# Patient Record
Sex: Female | Born: 1955 | Race: White | Hispanic: No | Marital: Married | Smoking: Current every day smoker
Health system: Southern US, Community
[De-identification: ages and names within clinical notes are randomized; demographics above are authoritative.]

## PROBLEM LIST (undated history)

## (undated) DIAGNOSIS — I10 Essential (primary) hypertension: Secondary | ICD-10-CM

## (undated) DIAGNOSIS — F410 Panic disorder [episodic paroxysmal anxiety] without agoraphobia: Secondary | ICD-10-CM

## (undated) DIAGNOSIS — J382 Nodules of vocal cords: Secondary | ICD-10-CM

## (undated) DIAGNOSIS — M549 Dorsalgia, unspecified: Secondary | ICD-10-CM

## (undated) DIAGNOSIS — K589 Irritable bowel syndrome without diarrhea: Secondary | ICD-10-CM

## (undated) DIAGNOSIS — G8929 Other chronic pain: Secondary | ICD-10-CM

## (undated) HISTORY — DX: Nodules of vocal cords: J38.2

## (undated) HISTORY — DX: Essential (primary) hypertension: I10

## (undated) HISTORY — DX: Panic disorder (episodic paroxysmal anxiety): F41.0

## (undated) HISTORY — DX: Other chronic pain: G89.29

## (undated) HISTORY — PX: TONSILLECTOMY: SUR1361

## (undated) HISTORY — DX: Dorsalgia, unspecified: M54.9

## (undated) HISTORY — DX: Irritable bowel syndrome, unspecified: K58.9

---

## 2000-02-16 ENCOUNTER — Encounter: Admission: RE | Admit: 2000-02-16 | Discharge: 2000-02-16 | Payer: Self-pay | Admitting: Obstetrics and Gynecology

## 2000-02-16 ENCOUNTER — Encounter: Payer: Self-pay | Admitting: Obstetrics and Gynecology

## 2002-04-16 ENCOUNTER — Encounter: Payer: Self-pay | Admitting: Obstetrics and Gynecology

## 2002-04-16 ENCOUNTER — Encounter: Admission: RE | Admit: 2002-04-16 | Discharge: 2002-04-16 | Payer: Self-pay | Admitting: Obstetrics and Gynecology

## 2002-12-05 ENCOUNTER — Encounter: Payer: Self-pay | Admitting: Gastroenterology

## 2002-12-05 ENCOUNTER — Encounter: Admission: RE | Admit: 2002-12-05 | Discharge: 2002-12-05 | Payer: Self-pay | Admitting: Gastroenterology

## 2003-09-24 ENCOUNTER — Encounter: Admission: RE | Admit: 2003-09-24 | Discharge: 2003-09-24 | Payer: Self-pay | Admitting: Obstetrics and Gynecology

## 2004-10-07 ENCOUNTER — Encounter: Admission: RE | Admit: 2004-10-07 | Discharge: 2004-10-07 | Payer: Self-pay | Admitting: Obstetrics and Gynecology

## 2004-12-15 ENCOUNTER — Encounter (INDEPENDENT_AMBULATORY_CARE_PROVIDER_SITE_OTHER): Payer: Self-pay | Admitting: *Deleted

## 2004-12-15 ENCOUNTER — Ambulatory Visit (HOSPITAL_BASED_OUTPATIENT_CLINIC_OR_DEPARTMENT_OTHER): Admission: RE | Admit: 2004-12-15 | Discharge: 2004-12-15 | Payer: Self-pay | Admitting: Orthopedic Surgery

## 2004-12-15 ENCOUNTER — Ambulatory Visit (HOSPITAL_COMMUNITY): Admission: RE | Admit: 2004-12-15 | Discharge: 2004-12-15 | Payer: Self-pay | Admitting: Orthopedic Surgery

## 2005-10-30 ENCOUNTER — Encounter: Admission: RE | Admit: 2005-10-30 | Discharge: 2005-10-30 | Payer: Self-pay | Admitting: Obstetrics and Gynecology

## 2006-10-31 ENCOUNTER — Encounter: Admission: RE | Admit: 2006-10-31 | Discharge: 2006-10-31 | Payer: Self-pay | Admitting: Obstetrics and Gynecology

## 2007-11-04 ENCOUNTER — Encounter: Admission: RE | Admit: 2007-11-04 | Discharge: 2007-11-04 | Payer: Self-pay | Admitting: Obstetrics and Gynecology

## 2008-11-04 ENCOUNTER — Encounter: Admission: RE | Admit: 2008-11-04 | Discharge: 2008-11-04 | Payer: Self-pay | Admitting: Obstetrics and Gynecology

## 2009-11-08 ENCOUNTER — Encounter: Admission: RE | Admit: 2009-11-08 | Discharge: 2009-11-08 | Payer: Self-pay | Admitting: Obstetrics and Gynecology

## 2010-10-22 ENCOUNTER — Other Ambulatory Visit: Payer: Self-pay | Admitting: Obstetrics and Gynecology

## 2010-10-22 DIAGNOSIS — Z1239 Encounter for other screening for malignant neoplasm of breast: Secondary | ICD-10-CM

## 2010-11-14 ENCOUNTER — Ambulatory Visit
Admission: RE | Admit: 2010-11-14 | Discharge: 2010-11-14 | Disposition: A | Discharge status: Home / Self Care | Payer: 59 | Source: Ambulatory Visit | Attending: Obstetrics and Gynecology | Admitting: Obstetrics and Gynecology

## 2010-11-14 DIAGNOSIS — Z1239 Encounter for other screening for malignant neoplasm of breast: Secondary | ICD-10-CM

## 2011-02-17 NOTE — Op Note (Signed)
Tiffany Flowers, BARLEY                ACCOUNT NO.:  [id number]   MEDICAL RECORD NO.:  [id number]          PATIENT TYPE:  AMB   LOCATION:  DSC                          FACILITY:  [facility]   PHYSICIAN:  Cindee Salt, M.D.       DATE OF BIRTH:  1956/05/16   DATE OF PROCEDURE:  12/15/2004  DATE OF DISCHARGE:                                 OPERATIVE REPORT   PREOPERATIVE DIAGNOSIS:  Right carpal tunnel syndrome and mucoid cyst, right  middle finger.   POSTOPERATIVE DIAGNOSIS:  Right carpal tunnel syndrome and mucoid cyst,  right middle finger.   OPERATION:  Carpal tunnel release right hand with excision mucoid cyst,  debridement distal interphalangeal joint, right middle finger.   SURGEON:  Cindee Salt, M.D.   ANESTHESIA:  Forearm based IV regional.   HISTORY:  The patient is a 55 year old female with history of carpal tunnel  syndrome, EMG nerve conductions positive, which has not responded to  conservative treatment. She also has a mucoid cyst on her right middle  finger, degenerative changes on x-ray.  She has failed conservative  treatment and desires removal with debridement and release.   PROCEDURE:  The patient is brought to the operating room. Her fingers and  wrist were marked.  She is taken to the operating room where a forearm based  IV regional anesthetic was carried out without difficulty. She was prepped  using DuraPrep, supine position, right arm free. The limb was exsanguinated  during the prep.  The forearm tourniquet was used.  A longitudinal incision  was made in the palm and carried down through subcutaneous tissue. Bleeders  were electrocauterized. Palmar fascia was split, superficial palmar arch  identified.  The  flexor tendon of the ring and little finger identified to  the ulnar side of the median nerve.  Carpal retinaculum was incised with  sharp dissection. A right angle and Sewell retractor placed between skin and  forearm fashion. Fascia was released for  approximately 5 cm proximal to the  wrist crease under direct vision.  Canal was explored. No further lesions  were identified.  An area of compression of the nerve was apparent.  The  wound was irrigated. Skin closed interrupted 5-0 nylon sutures. A separate  incision was then made over the distal interphalangeal joint of the right  middle finger, carried down through subcutaneous tissue. The dissection  carried down.  The cyst identified distally beneath the skin. This was  removed with the micro rongeur. This was sent to pathology. The joint was  opened. Osteophytes were removed from the corners of the distal phalanx and  middle phalanx. The wound was irrigated. The skin closed with interrupted 5-  0 nylon sutures. Sterile compressive dressing to the finger applied.  compressive dressing and splint to the wrist applied. The patient tolerated  the procedure well was taken to the recovery observation in satisfactory  condition. She will be discharged home to return to the [facility] of  [place] in one week on Vicodin.    GK/MEDQ  D:  12/15/2004  T:  12/15/2004  Job:  [id number] 

## 2011-10-18 ENCOUNTER — Other Ambulatory Visit: Payer: Self-pay | Admitting: Physician Assistant

## 2011-10-18 DIAGNOSIS — Z1231 Encounter for screening mammogram for malignant neoplasm of breast: Secondary | ICD-10-CM

## 2011-11-20 ENCOUNTER — Ambulatory Visit: Payer: 59

## 2011-11-27 ENCOUNTER — Ambulatory Visit
Admission: RE | Admit: 2011-11-27 | Discharge: 2011-11-27 | Disposition: A | Discharge status: Home / Self Care | Payer: 59 | Source: Ambulatory Visit | Attending: Physician Assistant | Admitting: Physician Assistant

## 2011-11-27 DIAGNOSIS — Z1231 Encounter for screening mammogram for malignant neoplasm of breast: Secondary | ICD-10-CM

## 2011-11-27 LAB — HM MAMMOGRAPHY

## 2012-10-22 ENCOUNTER — Other Ambulatory Visit: Payer: Self-pay | Admitting: Physician Assistant

## 2012-10-22 DIAGNOSIS — Z1231 Encounter for screening mammogram for malignant neoplasm of breast: Secondary | ICD-10-CM

## 2012-11-30 ENCOUNTER — Encounter: Payer: Self-pay | Admitting: *Deleted

## 2012-12-02 ENCOUNTER — Ambulatory Visit: Payer: 59

## 2012-12-20 ENCOUNTER — Encounter: Payer: Self-pay | Admitting: Physician Assistant

## 2012-12-20 ENCOUNTER — Ambulatory Visit (INDEPENDENT_AMBULATORY_CARE_PROVIDER_SITE_OTHER): Payer: 59 | Admitting: Physician Assistant

## 2012-12-20 ENCOUNTER — Other Ambulatory Visit: Payer: Self-pay | Admitting: Physician Assistant

## 2012-12-20 VITALS — BP 128/76 | HR 68 | Temp 98.7°F | Resp 18 | Ht 59.25 in | Wt 109.0 lb

## 2012-12-20 DIAGNOSIS — Z1239 Encounter for other screening for malignant neoplasm of breast: Secondary | ICD-10-CM

## 2012-12-20 DIAGNOSIS — N3281 Overactive bladder: Secondary | ICD-10-CM

## 2012-12-20 DIAGNOSIS — Z01419 Encounter for gynecological examination (general) (routine) without abnormal findings: Secondary | ICD-10-CM | POA: Insufficient documentation

## 2012-12-20 DIAGNOSIS — I10 Essential (primary) hypertension: Secondary | ICD-10-CM

## 2012-12-20 DIAGNOSIS — N318 Other neuromuscular dysfunction of bladder: Secondary | ICD-10-CM

## 2012-12-20 DIAGNOSIS — G4700 Insomnia, unspecified: Secondary | ICD-10-CM

## 2012-12-20 DIAGNOSIS — G894 Chronic pain syndrome: Secondary | ICD-10-CM

## 2012-12-20 DIAGNOSIS — F410 Panic disorder [episodic paroxysmal anxiety] without agoraphobia: Secondary | ICD-10-CM

## 2012-12-20 MED ORDER — ACETAMINOPHEN-CODEINE #3 300-30 MG PO TABS: 1.0000 | ORAL_TABLET | ORAL | Status: DC | PRN

## 2012-12-20 MED ORDER — SERTRALINE HCL 50 MG PO TABS: 50.0000 mg | ORAL_TABLET | Freq: Every day | ORAL | Status: DC

## 2012-12-21 ENCOUNTER — Other Ambulatory Visit: Payer: Self-pay | Admitting: Physician Assistant

## 2012-12-21 NOTE — Progress Notes (Signed)
Patient ID: Tiffany Flowers MRN: [id number], DOB: Feb 11, 1956, 57 y.o. Date of Encounter: 12/21/2012,   Chief Complaint: Breast & Pelvic Exam. Also discuss depression/ panic.  HPI: 57 y.o. y/o female who saw Dr. Elana Alm in the past for Gyn care is here for her annual pelvic and breast exam.  Also, at her LOV with me 07/24/12 we discussed depression, which was a new problem for her. She was also having insomnia. I Rxed Wellbutrin and Ambien at that time. Today she reports that the Wellbutrin made her feel staggery and she quit taking it. Says she isnot feeling depressed any more. Thinks a lot of depression at that time was situational. Her cat (15y/o) had just died, her mom was very ill, etc. Today she says her mom got better. Her husband is no longer working out of town. She is no longer having tearfulness and is finding pleasure in things again. However, she does report that over the past 2 weeks she has had 3 episodes of panic while driving. Has been quite significant to the point that she needs to pull over and get the car off the road.   The Ambien CR works well for her insomnia. No adverse effects. On the weekends she uses melatonin instead so that she is not using the Ambien all the time to decrease dependence issues.  Review of Systems: Consitutional: No fever, chills, fatigue, night sweats, lymphadenopathy. No significant/unexplained weight changes. Eyes: No visual changes, eye redness, or discharge. ENT/Mouth: No ear pain, sore throat, nasal drainage, or sinus pain. Cardiovascular: No chest pressure,heaviness, tightness or squeezing, even with exertion. No increased shortness of breath or dyspnea on exertion.No palpitations, edema, orthopnea, PND. Respiratory: No cough, hemoptysis, SOB, or wheezing. Gastrointestinal: No anorexia, dysphagia, reflux, pain, nausea, vomiting, hematemesis, diarrhea, constipation, BRBPR, or melena. Breast: No mass, nodules, bulging, or retraction. No skin  changes or inflammation. No nipple discharge. No lymphadenopathy. Genitourinary: No dysuria, hematuria, incontinence, vaginal discharge, pruritis, burning, abnormal bleeding, or pain. Musculoskeletal: No decreased ROM, No joint pain or swelling. No significant pain in neck, back, or extremities. Skin: No rash, pruritis, or concerning lesions. Neurological: No headache, dizziness, syncope, seizures, tremors, memory loss, coordination problems, or paresthesias. Psychological: No anxiety, depression, hallucinations, SI/HI. Endocrine: No polydipsia, polyphagia, polyuria, or known diabetes.No increased fatigue. No palpitations/rapid heart rate. No significant/unexplained weight change. All other systems were reviewed and are otherwise negative.  Past Medical History  Diagnosis Date  . Back pain     Bulging discs  . Vocal cord nodules     Removed     Past Surgical History  Procedure Laterality Date  . Cesarean section      x 2  . Tonsillectomy      Home Meds:  Current Outpatient Prescriptions on File Prior to Visit  Medication Sig Dispense Refill  . ALPRAZolam (XANAX) 0.25 MG tablet Take 0.25 mg by mouth at bedtime as needed for sleep.      Marland Kitchen oxybutynin (DITROPAN-XL) 10 MG 24 hr tablet Take 10 mg by mouth daily.       No current facility-administered medications on file prior to visit.    Allergies:  Allergies  Allergen Reactions  . Wellbutrin (Bupropion) Other (See Comments)    Felt staggery    History   Social History  . Marital Status: Married    Spouse Name: N/A    Number of Children: N/A  . Years of Education: N/A   Occupational History  . Not on file.  Social History Main Topics  . Smoking status: Current Every Day Smoker    Types: Cigarettes  . Smokeless tobacco: Never Used  . Alcohol Use: Yes  . Drug Use: No  . Sexually Active: Not on file   Other Topics Concern  . Not on file   Social History Narrative  . No narrative on file    Family History   Problem Relation Age of Onset  . Heart disease Father 55    CABG  . Hyperlipidemia Sister     Physical Exam: Blood pressure 128/76, pulse 68, temperature 98.7 F (37.1 C), temperature source Oral, resp. rate 18, height 4' 11.25" (1.505 m), weight 109 lb (49.442 kg)., Body mass index is 21.83 kg/(m^2). General: Well developed, well nourished, in no acute distress.   Neck: Supple. Trachea midline. No thyromegaly. Full ROM. No lymphadenopathy.No Carotid Bruits. Lungs: Clear to auscultation bilaterally without wheezes, rales, or rhonchi. Breathing is of normal effort and unlabored. Cardiovascular: RRR with S1 S2. No murmurs, rubs, or gallops. Distal pulses 2+ symmetrically. No carotid or abdominal bruits. Breast: Symmetrical. No masses. Nipples without discharge. Abdomen: Soft, non-tender, non-distended with normoactive bowel sounds. No hepatosplenomegaly or masses. No rebound/guarding. No CVA tenderness. No hernias.  Genitourinary:  External genitalia without lesions. Vaginal mucosa pink.No discharge present. Cervix pink and without discharge. No cervical tenderness.Normal uterus size. No adnexal mass or tenderness.  Skin: Warm and moist without erythema, ecchymosis, wounds, or rash. Neuro: A+Ox3. CN II-XII grossly intact. Moves all extremities spontaneously. Full sensation throughout. Normal gait. Psych:  Responds to questions appropriately with a normal affect.     Immunization History  Administered Date(s) Administered  . Influenza Split 10/02/2009    Assessment/Plan:  57 y.o. y/o female here for: 1-Gyn Exam; Pelvic/Breast Exam.  Repeat Pap not indicated today. Had normal pap 11/28/11. Wait 3-5 years to repeat. She already has screening mammogram scheduled for Monday. Last Mammo 11/27/11 normal. 2-H/O Situational Depression- Now resolved. 3- Panic D/O: Will Start Zoloft 50 mg QD. Discussed proper use and expectation of medication at length. To f/u if dev any adv effects. O/W F/U 6  weeks. 4-Insomnia: Cont Ambien CR 12.5 mg , alternating with Melatonin on weekends and when possible. 5- Pain; Uses Tylenol 3 prn- will refill.  6- Discussed Checking Lipid Panel and other screening labs. She defers.  7- Screening Colonoscopy: She reports that she had this 04/2012 and that it was nml. I did not get a copy of the report.  -  [address] [place], [place], [facility] 12/21/2012 7:15 PM

## 2012-12-23 ENCOUNTER — Ambulatory Visit
Admission: RE | Admit: 2012-12-23 | Discharge: 2012-12-23 | Disposition: A | Discharge status: Home / Self Care | Payer: 59 | Source: Ambulatory Visit | Attending: Physician Assistant | Admitting: Physician Assistant

## 2012-12-23 DIAGNOSIS — Z1231 Encounter for screening mammogram for malignant neoplasm of breast: Secondary | ICD-10-CM

## 2013-01-24 ENCOUNTER — Other Ambulatory Visit: Payer: Self-pay | Admitting: Physician Assistant

## 2013-01-24 NOTE — Telephone Encounter (Signed)
Last visit 12/20/12.  Ok for refills on Alprazolam 0.25mg  bid prn #60 nrf.    And Ditropan-xl 10mg  #30 prn refills. Rx's called/sent to pharmacy.

## 2013-02-03 ENCOUNTER — Other Ambulatory Visit: Payer: Self-pay | Admitting: Physician Assistant

## 2013-02-03 NOTE — Telephone Encounter (Signed)
Approved. # 60/ one additional refill

## 2013-02-03 NOTE — Telephone Encounter (Signed)
Medication refilled per protocol. 

## 2013-02-03 NOTE — Telephone Encounter (Signed)
Last refill 12/20/12.  Last ov 12/20/12 Need approval for controlled medication.

## 2013-02-06 ENCOUNTER — Telehealth: Payer: Self-pay | Admitting: Family Medicine

## 2013-02-06 NOTE — Telephone Encounter (Signed)
Pt states BP has be high within the last couple days been ranging around 152/92-152/94 since Saturday.pt wants to know can she double up on her amlodipine 5mg ?

## 2013-02-06 NOTE — Telephone Encounter (Signed)
Yes, take 10 mg poqday and recheck in 2 weeks.

## 2013-02-07 NOTE — Telephone Encounter (Signed)
..  Patient aware per vm 

## 2013-03-02 ENCOUNTER — Other Ambulatory Visit: Payer: Self-pay | Admitting: Physician Assistant

## 2013-03-04 ENCOUNTER — Other Ambulatory Visit: Payer: Self-pay | Admitting: Family Medicine

## 2013-03-04 MED ORDER — ALPRAZOLAM 0.25 MG PO TABS: 0.2500 mg | ORAL_TABLET | Freq: Every evening | ORAL | Status: DC | PRN

## 2013-03-04 NOTE — Telephone Encounter (Signed)
Refill appropriate.  Just seen in March.  Only uses at bedtime as needed.  #30 + 0 rf called

## 2013-04-18 ENCOUNTER — Other Ambulatory Visit: Payer: Self-pay | Admitting: Physician Assistant

## 2013-04-18 NOTE — Telephone Encounter (Signed)
Last refill 03/04/13 #30.  Last OV 12/20/12 CPE  Refill appropriate  #30 called 0 + 0 RF

## 2013-04-18 NOTE — Telephone Encounter (Signed)
agree

## 2013-05-13 ENCOUNTER — Other Ambulatory Visit: Payer: Self-pay | Admitting: Physician Assistant

## 2013-05-13 NOTE — Telephone Encounter (Signed)
Last refill 04/18/13.  Pt also needs to be seen , has been awhile.  Refill denied (too soon). Called pt left mess to make OV.

## 2013-05-13 NOTE — Telephone Encounter (Signed)
?   Ok to refill, last refill 04/18/13

## 2013-05-16 ENCOUNTER — Telehealth: Payer: Self-pay | Admitting: Family Medicine

## 2013-05-16 MED ORDER — ALPRAZOLAM 0.25 MG PO TABS: 0.2500 mg | ORAL_TABLET | Freq: Every evening | ORAL | Status: DC | PRN

## 2013-05-16 NOTE — Telephone Encounter (Signed)
Seen in March for annual exam.  Refill appropriate.  One month called in.

## 2013-06-02 ENCOUNTER — Other Ambulatory Visit: Payer: Self-pay | Admitting: Physician Assistant

## 2013-06-04 ENCOUNTER — Encounter: Payer: Self-pay | Admitting: Family Medicine

## 2013-06-04 NOTE — Telephone Encounter (Signed)
Her LOV was 3/14. Started Zoloft new-pt was to have f/u OV 6 weeks. I see no f/u OV since that visit. Have her schedule OV.

## 2013-06-04 NOTE — Telephone Encounter (Signed)
Letter sent to patient to schedule appt.

## 2013-06-04 NOTE — Telephone Encounter (Signed)
BP med refilled.  Xanax denied, too early.  Pharmacy called.

## 2013-06-09 ENCOUNTER — Telehealth: Payer: Self-pay | Admitting: Family Medicine

## 2013-06-09 NOTE — Telephone Encounter (Signed)
Left mess for patient that she is due for 6 mth OV.  Had requested last week refill of her Xanax.  Refill request was too early.  Pt stated she did not really need right now she was just thinking ahead.  Also discussed refill of Tylenol #3 which she says she takes prn stomach upset??  Also not needed that as well.  Told when refills due contact her pharmacy for them to request.  Also states now has new job and this office not really convenient any more and will be transferring soon the [facility].  Discussed with her how to get records transferred.

## 2013-06-17 ENCOUNTER — Other Ambulatory Visit: Payer: Self-pay | Admitting: Physician Assistant

## 2013-06-17 NOTE — Telephone Encounter (Signed)
Last refill 8/15.   Had asked for refill earlier this month was denied because too early.  Now due for refill.  Last OV 12/21/12.  OK refill??

## 2013-06-18 NOTE — Telephone Encounter (Signed)
Ok to refill 

## 2013-06-18 NOTE — Telephone Encounter (Signed)
I reviewed her last office visit noted which is 12/20/12. At that time I started Zoloft and she was to have a followup office visit in 6 weeks. Needs office visit. No further refills until office visit

## 2013-06-19 NOTE — Telephone Encounter (Signed)
Pt aware needs office

## 2013-06-23 ENCOUNTER — Telehealth: Payer: Self-pay | Admitting: Family Medicine

## 2013-06-23 NOTE — Telephone Encounter (Signed)
Xanax 0.25 1 QHS prn  Pt has an appt Monday and she says that she gets bad anxiety driving. She wants to know if we can send in one or two pills until her appt to get it through having to drive here and to one other appt she has this week.

## 2013-06-24 NOTE — Telephone Encounter (Signed)
Do you want to refill? 

## 2013-06-25 NOTE — Telephone Encounter (Signed)
Her last office visit was in March. At that time we started new medication and she was to follow up in 6 weeks. We really need to follow up regarding having her on another type of medication to control her anxiety and panic. I really do not advised her taking the Xanax right before driving. No refill now. Keep her followup appointment to discuss.

## 2013-06-26 NOTE — Telephone Encounter (Signed)
Left patient mess to keep appt here on Monday.  Provider will discuss anxiety issues with her then.  We can not prescribe xanax knowing she is taking and then driving.

## 2013-06-30 ENCOUNTER — Encounter: Payer: Self-pay | Admitting: Physician Assistant

## 2013-06-30 ENCOUNTER — Ambulatory Visit (INDEPENDENT_AMBULATORY_CARE_PROVIDER_SITE_OTHER): Payer: 59 | Admitting: Physician Assistant

## 2013-06-30 VITALS — BP 118/76 | HR 84 | Temp 98.7°F | Resp 18 | Ht 60.5 in | Wt 109.0 lb

## 2013-06-30 DIAGNOSIS — G894 Chronic pain syndrome: Secondary | ICD-10-CM

## 2013-06-30 DIAGNOSIS — N318 Other neuromuscular dysfunction of bladder: Secondary | ICD-10-CM

## 2013-06-30 DIAGNOSIS — N3281 Overactive bladder: Secondary | ICD-10-CM

## 2013-06-30 DIAGNOSIS — K589 Irritable bowel syndrome without diarrhea: Secondary | ICD-10-CM

## 2013-06-30 DIAGNOSIS — F410 Panic disorder [episodic paroxysmal anxiety] without agoraphobia: Secondary | ICD-10-CM

## 2013-06-30 DIAGNOSIS — G4700 Insomnia, unspecified: Secondary | ICD-10-CM

## 2013-06-30 DIAGNOSIS — I10 Essential (primary) hypertension: Secondary | ICD-10-CM

## 2013-06-30 MED ORDER — MIRABEGRON ER 25 MG PO TB24: 25.0000 mg | ORAL_TABLET | Freq: Every day | ORAL | Status: DC

## 2013-06-30 MED ORDER — DICYCLOMINE HCL 10 MG PO CAPS: 10.0000 mg | ORAL_CAPSULE | Freq: Three times a day (TID) | ORAL | Status: DC

## 2013-06-30 MED ORDER — ACETAMINOPHEN-CODEINE #3 300-30 MG PO TABS: 1.0000 | ORAL_TABLET | Freq: Four times a day (QID) | ORAL | Status: DC | PRN

## 2013-06-30 MED ORDER — FLUOXETINE HCL 10 MG PO TABS: 10.0000 mg | ORAL_TABLET | Freq: Every day | ORAL | Status: DC

## 2013-06-30 MED ORDER — ALPRAZOLAM 0.25 MG PO TABS: 0.2500 mg | ORAL_TABLET | Freq: Two times a day (BID) | ORAL | Status: DC | PRN

## 2013-06-30 NOTE — Progress Notes (Signed)
Patient ID: DANIELE DILLOW MRN: [id number], DOB: 11-15-1955, 57 y.o. Date of Encounter: @DATE @  Chief Complaint:  Chief Complaint  Patient presents with  . check up for med refills    states needs xanax BID    HPI: 57 y.o. year old white female  presents for routine follow up office visit.  #1 panic disorder: At her last office visit with me on 12/21/12 she reported having episodes of panic while driving. At that visit I prescribed Zoloft 50 mg a day. Also prescribed Xanax to use as needed. Today she reports that she stayed on the Zoloft for about 2-3 months but saw no effect so she stopped it. She has been taking Xanax prior to driving as she is currently driving on [place] and places that she is thinks may cause panic. Says that the Xanax does not cause her to feel loopy or drowsy.  #2 insomnia:  At her prior office visit she comes she was using Ambien CR and was working well with no adverse effects. However today she reports that since then it started causing her to have "crazy dreams quite and her husband realize that she was acting "weird "since she has stopped the Ambien. She uses melatonin at night and sometimes takes Xanax at night and this is working well for her.  #3 overactive bladder: She states that she recently had a job change and it's therefore an insurance change. States that the current medication Ditropan XL is now too expensive shoes she cannot stop it. Also says it is causing significant dry mouth so she thinks she says can go off the medication.  #4 she continues to take Tylenol threes as needed for abdominal bloating and abdominal pain. Gets crampy pains at times.   Past Medical History  Diagnosis Date  . Back pain     Bulging discs  . Vocal cord nodules     Removed  . Panic disorder      Home Meds: See attached medication section for current medication list. Any medications entered into computer today will not appear on this note's list. The medications  listed below were entered prior to today. Current Outpatient Prescriptions on File Prior to Visit  Medication Sig Dispense Refill  . amLODipine (NORVASC) 5 MG tablet TAKE 1 TABLET BY MOUTH EVERY DAY  30 tablet  5  . fish oil-omega-3 fatty acids 1000 MG capsule Take 1 g by mouth daily.      . Multiple Vitamin (MULTIVITAMIN) tablet Take 1 tablet by mouth daily.       No current facility-administered medications on file prior to visit.    Allergies:  Allergies  Allergen Reactions  . Wellbutrin [Bupropion] Other (See Comments)    Felt staggery    History   Social History  . Marital Status: Married    Spouse Name: N/A    Number of Children: N/A  . Years of Education: N/A   Occupational History  . Not on file.   Social History Main Topics  . Smoking status: Current Every Day Smoker    Types: Cigarettes  . Smokeless tobacco: Never Used  . Alcohol Use: Yes  . Drug Use: No  . Sexual Activity: Not on file   Other Topics Concern  . Not on file   Social History Narrative  . No narrative on file    Family History  Problem Relation Age of Onset  . Heart disease Father 90    CABG  . Hyperlipidemia Sister  Review of Systems:  See HPI for pertinent ROS. All other ROS negative.    Physical Exam: Blood pressure 118/76, pulse 84, temperature 98.7 F (37.1 C), temperature source Oral, resp. rate 18, height 5' 0.5" (1.537 m), weight 109 lb (49.442 kg)., Body mass index is 20.93 kg/(m^2). General: Well-nourished well-developed white female . Appears in no acute distress. Lungs: Clear bilaterally to auscultation without wheezes, rales, or rhonchi. Breathing is unlabored. Heart: RRR with S1 S2. No murmurs, rubs, or gallops. Abdomen: Soft, non-tender, non-distended with normoactive bowel sounds. No hepatomegaly. No rebound/guarding. No obvious abdominal masses. Musculoskeletal:  Strength and tone normal for age. Extremities/Skin: Warm and dry. No clubbing or cyanosis. No edema.  No rashes or suspicious lesions. Neuro: Alert and oriented X 3. Moves all extremities spontaneously. Gait is normal. CNII-XII grossly in tact. Psych:  Responds to questions appropriately with a normal affect.very pleasant. Very appropriate does not seem anxious.      ASSESSMENT AND PLAN:  57 y.o. year old female with  1. Panic disorder We'll try Prozac. She is to take one at the 10 mg tablets for one week then increase up to 2 tablets a day to equal 20 mg daily. I told her this time if she develops any adverse effects and call me otherwise continue the medication to followup office visit in 6 weeks. Explained that this is a very low dose and that she may require higher doses have not stoppages because she does not see any effectiveness. - ALPRAZolam (XANAX) 0.25 MG tablet; Take 1 tablet (0.25 mg total) by mouth 2 (two) times daily as needed for sleep or anxiety.  Dispense: 60 tablet; Refill: 1 - FLUoxetine (PROZAC) 10 MG tablet; Take 1 tablet (10 mg total) by mouth daily.  Dispense: 30 tablet; Refill: 0  2. Insomnia History of intolerance to Ambien CR. Continue melatonin and Xanax when necessary. - ALPRAZolam (XANAX) 0.25 MG tablet; Take 1 tablet (0.25 mg total) by mouth 2 (two) times daily as needed for sleep or anxiety.  Dispense: 60 tablet; Refill: 1  3. Essential hypertension, benign At goal. Continue current medication  4. Overactive bladder Current medication is causing adverse effects. Also very expensive. We'll switch to me or Luisa Hart. - mirabegron ER (MYRBETRIQ) 25 MG TB24 tablet; Take 1 tablet (25 mg total) by mouth daily.  Dispense: 30 tablet; Refill: 3  5. Chronic pain syndrome - acetaminophen-codeine (TYLENOL #3) 300-30 MG per tablet; Take 1 tablet by mouth every 6 (six) hours as needed for pain.  Dispense: 60 tablet; Refill: 1  6. IBS (irritable bowel syndrome) We'll add fentanyl. This would work better and in the future she needs to get off of the Tylenol threes. We'll  continue Tylenol 3 for now while she adjusted until. - dicyclomine (BENTYL) 10 MG capsule; Take 1 capsule (10 mg total) by mouth 4 (four) times daily -  before meals and at bedtime.  Dispense: 120 capsule; Refill: 3  Followup office visit 6 weeks or sooner she needs [place].  [address] [place], [place], [facility] 06/30/2013 8:39 AM

## 2013-07-02 ENCOUNTER — Telehealth: Payer: Self-pay | Admitting: Physician Assistant

## 2013-07-02 NOTE — Telephone Encounter (Signed)
Given her circumstances, since the only day she is available to come is November 11th,  would have her go ahead and schedule an appointment to see Dr. Jeanice Lim on that date. Dr. Jeanice Lim is able to read my notes and know what is going with the patient to provide good care for her on that date.

## 2013-07-02 NOTE — Telephone Encounter (Signed)
Pt needs to come in for an appointment for a med check but she just started a new job and the only day they will let her off is 08/12/13 because they are closed. You are not here that day. She wants to know what she can do. Dr. Jeanice Lim is here 08/12/13 so she was not sure if she could see her that day. I told her that she really needs to stick with the same provider, so she does not know what she should do. She works M-F 8-5.

## 2013-07-04 NOTE — Telephone Encounter (Signed)
Pt returned my calls.  November 11 is only day she can come for her follow up visit.  Appt made

## 2013-07-11 ENCOUNTER — Other Ambulatory Visit: Payer: Self-pay | Admitting: Physician Assistant

## 2013-07-11 NOTE — Telephone Encounter (Signed)
Both refill 9/29??  OK refill??

## 2013-07-15 NOTE — Telephone Encounter (Signed)
At OV 9/29 I gave # 60 plus one refill on each of these meds.  Print [place] Drug Registry on this patient.  She must have filled the Rx given 9/29 at a different pharmacy than in past according to data given on refill request.  Print Drug Registry for me to review tomorrow.

## 2013-07-16 MED ORDER — FLUOXETINE HCL 20 MG PO TABS: 20.0000 mg | ORAL_TABLET | Freq: Every day | ORAL | Status: DC

## 2013-07-16 NOTE — Telephone Encounter (Signed)
For the Prozac, she was given a very low dose of 10 mg to start at the office visit. We'll go ahead and increase this to 20.  Send in prescription for Prozac 20 mg 1 by mouth daily #30 with 0 refills.  she is to have a followup office visit in the next 3-4 weeks. Ok to refill the alprazolam for one month supply plus one additional refill.

## 2013-07-16 NOTE — Telephone Encounter (Signed)
Spoke to patient.  Rx's were marked as called in.  Apparently they were not.  Understands now that she will have to pick up pain med refill due to new law.  States she will get that at follow up appt on 08/12/13.  Please call in refill for Alprazolam  and Prozac.

## 2013-07-16 NOTE — Addendum Note (Signed)
Addended by: Donne Anon on: 07/16/2013 04:30 PM   Modules accepted: Orders, Medications

## 2013-07-16 NOTE — Telephone Encounter (Signed)
Refills done as ordered by provider

## 2013-07-27 ENCOUNTER — Other Ambulatory Visit: Payer: Self-pay | Admitting: Physician Assistant

## 2013-07-28 NOTE — Telephone Encounter (Signed)
Medication refilled per protocol. 

## 2013-07-30 ENCOUNTER — Ambulatory Visit: Payer: 59 | Admitting: Physician Assistant

## 2013-08-12 ENCOUNTER — Encounter: Payer: Self-pay | Admitting: Family Medicine

## 2013-08-12 ENCOUNTER — Ambulatory Visit (INDEPENDENT_AMBULATORY_CARE_PROVIDER_SITE_OTHER): Payer: 59 | Admitting: Family Medicine

## 2013-08-12 VITALS — BP 140/80 | HR 78 | Temp 98.6°F | Resp 18 | Ht 60.0 in | Wt 111.0 lb

## 2013-08-12 DIAGNOSIS — G894 Chronic pain syndrome: Secondary | ICD-10-CM

## 2013-08-12 DIAGNOSIS — K589 Irritable bowel syndrome without diarrhea: Secondary | ICD-10-CM

## 2013-08-12 DIAGNOSIS — F410 Panic disorder [episodic paroxysmal anxiety] without agoraphobia: Secondary | ICD-10-CM

## 2013-08-12 DIAGNOSIS — Z23 Encounter for immunization: Secondary | ICD-10-CM

## 2013-08-12 DIAGNOSIS — F411 Generalized anxiety disorder: Secondary | ICD-10-CM | POA: Insufficient documentation

## 2013-08-12 DIAGNOSIS — I10 Essential (primary) hypertension: Secondary | ICD-10-CM

## 2013-08-12 DIAGNOSIS — G4700 Insomnia, unspecified: Secondary | ICD-10-CM

## 2013-08-12 MED ORDER — ALPRAZOLAM 0.25 MG PO TABS: ORAL_TABLET | ORAL | Status: DC

## 2013-08-12 MED ORDER — TEMAZEPAM 15 MG PO CAPS: 15.0000 mg | ORAL_CAPSULE | Freq: Every evening | ORAL | Status: DC | PRN

## 2013-08-12 MED ORDER — FLUOXETINE HCL 20 MG PO TABS: 20.0000 mg | ORAL_TABLET | Freq: Every day | ORAL | Status: DC

## 2013-08-12 MED ORDER — ACETAMINOPHEN-CODEINE #3 300-30 MG PO TABS: 1.0000 | ORAL_TABLET | Freq: Four times a day (QID) | ORAL | Status: DC | PRN

## 2013-08-12 NOTE — Assessment & Plan Note (Signed)
Will give her a trial of temazepam. She can stop the over-the-counter sleep aid and melatonin as this is not helping.

## 2013-08-12 NOTE — Progress Notes (Signed)
  Subjective:    Patient ID: Tiffany Flowers, female    DOB: Dec 13, 1955, 57 y.o.   MRN: [id number]  HPI  Patient here to followup medications. She was seen 6 weeks ago secondary to generalized anxiety disorder insomnia. She was started on Prozac and tapered up to 20 mg. She states that the Prozac is helping some but she continues to rely on her Xanax especially when driving. Her sleep is still very poor she typically takes melatonin as well as an over-the-counter sleep aid and occasionally Xanax. She continues to use the Tylenol #3 for her irritable bowel syndrome and is requesting refill on this.  Review of Systems  GEN- denies fatigue, fever, weight loss,weakness, recent illness HEENT- denies eye drainage, change in vision, nasal discharge, CVS- denies chest pain, palpitations RESP- denies SOB, cough, wheeze ABD- denies N/V, change in stools, abd pain Neuro- denies headache, dizziness, syncope, seizure activity      Objective:   Physical Exam GEN- NAD, alert and oriented x3, repeat BP 148/90 Psych- normal affect and mood Pulses- Radial 2+        Assessment & Plan:

## 2013-08-12 NOTE — Assessment & Plan Note (Signed)
Blood pressure elevated some today. Previously well controlled no change the medication

## 2013-08-12 NOTE — Assessment & Plan Note (Signed)
Continue Prozac 20 mg and Xanax as prescribed

## 2013-08-12 NOTE — Assessment & Plan Note (Signed)
Continue when necessary Xanax

## 2013-08-12 NOTE — Patient Instructions (Signed)
Continue TYlenol #3 Flu shot given Try Temazepam for sleep Continue prozac 20mg - new prescription sent Keep an eye on your blood pressure F/U 3 months

## 2013-08-12 NOTE — Assessment & Plan Note (Signed)
Continue dicyclomine. She is also on Tylenol #3. She's had gastroenterology workup in the past. She may need to return

## 2013-08-14 ENCOUNTER — Other Ambulatory Visit: Payer: Self-pay | Admitting: Physician Assistant

## 2013-09-03 ENCOUNTER — Other Ambulatory Visit: Payer: Self-pay | Admitting: Physician Assistant

## 2013-09-03 NOTE — Telephone Encounter (Signed)
Medication refilled per protocol. 

## 2013-10-07 ENCOUNTER — Other Ambulatory Visit: Payer: Self-pay | Admitting: Family Medicine

## 2013-10-10 NOTE — Telephone Encounter (Signed)
RX called in .

## 2013-10-10 NOTE — Telephone Encounter (Signed)
Okay to refill , give 2 refills 

## 2013-10-10 NOTE — Telephone Encounter (Signed)
Last RF 11/11 #30 + 1  Last OV 11/11  OK refill?

## 2013-11-17 ENCOUNTER — Other Ambulatory Visit: Payer: Self-pay

## 2013-11-17 DIAGNOSIS — Z1231 Encounter for screening mammogram for malignant neoplasm of breast: Secondary | ICD-10-CM

## 2013-12-10 ENCOUNTER — Other Ambulatory Visit: Payer: Self-pay | Admitting: Physician Assistant

## 2013-12-10 ENCOUNTER — Encounter: Payer: Self-pay | Admitting: Family Medicine

## 2013-12-10 DIAGNOSIS — I10 Essential (primary) hypertension: Secondary | ICD-10-CM

## 2013-12-10 NOTE — Telephone Encounter (Signed)
Medication refill for one time only.  Patient needs to be seen.  Letter sent for patient to call and schedule 

## 2013-12-29 ENCOUNTER — Ambulatory Visit
Admission: RE | Admit: 2013-12-29 | Discharge: 2013-12-29 | Disposition: A | Discharge status: Home / Self Care | Payer: 59 | Source: Ambulatory Visit

## 2013-12-29 DIAGNOSIS — Z1231 Encounter for screening mammogram for malignant neoplasm of breast: Secondary | ICD-10-CM

## 2013-12-30 ENCOUNTER — Ambulatory Visit (INDEPENDENT_AMBULATORY_CARE_PROVIDER_SITE_OTHER): Payer: 59 | Admitting: Family Medicine

## 2013-12-30 ENCOUNTER — Encounter: Payer: Self-pay | Admitting: Family Medicine

## 2013-12-30 VITALS — BP 132/76 | HR 72 | Temp 98.4°F | Resp 14 | Ht 60.0 in | Wt 112.0 lb

## 2013-12-30 DIAGNOSIS — Z124 Encounter for screening for malignant neoplasm of cervix: Secondary | ICD-10-CM

## 2013-12-30 DIAGNOSIS — G894 Chronic pain syndrome: Secondary | ICD-10-CM

## 2013-12-30 DIAGNOSIS — I10 Essential (primary) hypertension: Secondary | ICD-10-CM

## 2013-12-30 DIAGNOSIS — G4700 Insomnia, unspecified: Secondary | ICD-10-CM

## 2013-12-30 DIAGNOSIS — Z13820 Encounter for screening for osteoporosis: Secondary | ICD-10-CM

## 2013-12-30 DIAGNOSIS — Z130 Encounter for screening for diseases of the blood and blood-forming organs and certain disorders involving the immune mechanism: Secondary | ICD-10-CM

## 2013-12-30 DIAGNOSIS — Z0000 Encounter for general adult medical examination without abnormal findings: Secondary | ICD-10-CM

## 2013-12-30 DIAGNOSIS — Z1321 Encounter for screening for nutritional disorder: Secondary | ICD-10-CM

## 2013-12-30 DIAGNOSIS — Z13228 Encounter for screening for other metabolic disorders: Secondary | ICD-10-CM

## 2013-12-30 DIAGNOSIS — Z23 Encounter for immunization: Secondary | ICD-10-CM

## 2013-12-30 DIAGNOSIS — K589 Irritable bowel syndrome without diarrhea: Secondary | ICD-10-CM

## 2013-12-30 DIAGNOSIS — F411 Generalized anxiety disorder: Secondary | ICD-10-CM

## 2013-12-30 DIAGNOSIS — Z1329 Encounter for screening for other suspected endocrine disorder: Secondary | ICD-10-CM

## 2013-12-30 LAB — CBC WITH DIFFERENTIAL/PLATELET
Basophils Absolute: 0.1 K/uL (ref 0.0–0.1)
Basophils Relative: 1 % (ref 0–1)
Eosinophils Absolute: 0.1 K/uL (ref 0.0–0.7)
Eosinophils Relative: 2 % (ref 0–5)
HCT: 35.4 % — ABNORMAL LOW (ref 36.0–46.0)
Hemoglobin: 12 g/dL (ref 12.0–15.0)
Lymphocytes Relative: 32 % (ref 12–46)
Lymphs Abs: 1.7 K/uL (ref 0.7–4.0)
MCH: 33.4 pg (ref 26.0–34.0)
MCHC: 33.9 g/dL (ref 30.0–36.0)
MCV: 98.6 fL (ref 78.0–100.0)
Monocytes Absolute: 0.4 K/uL (ref 0.1–1.0)
Monocytes Relative: 8 % (ref 3–12)
Neutro Abs: 3.1 K/uL (ref 1.7–7.7)
Neutrophils Relative %: 57 % (ref 43–77)
Platelets: 267 K/uL (ref 150–400)
RBC: 3.59 MIL/uL — ABNORMAL LOW (ref 3.87–5.11)
RDW: 12.9 % (ref 11.5–15.5)
WBC: 5.4 K/uL (ref 4.0–10.5)

## 2013-12-30 LAB — COMPREHENSIVE METABOLIC PANEL
ALT: 16 U/L (ref 0–35)
AST: 21 U/L (ref 0–37)
Albumin: 4.6 g/dL (ref 3.5–5.2)
Alkaline Phosphatase: 48 U/L (ref 39–117)
BILIRUBIN TOTAL: 0.6 mg/dL (ref 0.2–1.2)
BUN: 8 mg/dL (ref 6–23)
CALCIUM: 9.5 mg/dL (ref 8.4–10.5)
CHLORIDE: 96 meq/L (ref 96–112)
CO2: 27 meq/L (ref 19–32)
Creat: 0.59 mg/dL (ref 0.50–1.10)
Glucose, Bld: 82 mg/dL (ref 70–99)
Potassium: 4.3 mEq/L (ref 3.5–5.3)
SODIUM: 131 meq/L — AB (ref 135–145)
TOTAL PROTEIN: 6.9 g/dL (ref 6.0–8.3)

## 2013-12-30 LAB — LIPID PANEL
Cholesterol: 187 mg/dL (ref 0–200)
HDL: 90 mg/dL (ref >39)
LDL Cholesterol: 74 mg/dL (ref 0–99)
Total CHOL/HDL Ratio: 2.1 Ratio
Triglycerides: 113 mg/dL (ref <150)
VLDL: 23 mg/dL (ref 0–40)

## 2013-12-30 LAB — VITAMIN D 25 HYDROXY (VIT D DEFICIENCY, FRACTURES): Vit D, 25-Hydroxy: 46 ng/mL (ref 30–89)

## 2013-12-30 MED ORDER — ALPRAZOLAM 0.25 MG PO TABS: ORAL_TABLET | ORAL | Status: DC

## 2013-12-30 MED ORDER — AMLODIPINE BESYLATE 5 MG PO TABS: ORAL_TABLET | ORAL | Status: DC

## 2013-12-30 MED ORDER — TEMAZEPAM 15 MG PO CAPS: ORAL_CAPSULE | ORAL | Status: DC

## 2013-12-30 MED ORDER — ACETAMINOPHEN-CODEINE #3 300-30 MG PO TABS: 1.0000 | ORAL_TABLET | Freq: Four times a day (QID) | ORAL | Status: DC | PRN

## 2013-12-30 NOTE — Assessment & Plan Note (Signed)
Well controlled on restoril.

## 2013-12-30 NOTE — Patient Instructions (Addendum)
Calcium 1200mg   , Vitamin D 1000IU I recommend eye visit once a year I recommend dental visit every 6 months Goal is to  Exercise 30 minutes 5 days a week We will send a letter with lab results  Bone density to be set up  Tetanus given Release of records- Dr. Charna ElizabethJyothi Mann for Colonoscopy F/U 6 months for medications

## 2013-12-30 NOTE — Assessment & Plan Note (Signed)
Pap smear done her last one was in 2013 will set up for bone density test fasting labs tetanus booster given  Discussed calcium and vitamin D

## 2013-12-30 NOTE — Assessment & Plan Note (Signed)
Continue Tylenol #3 which helps with her IBS pain she's been on this for quite some time I will obtain her last colonoscopy

## 2013-12-30 NOTE — Assessment & Plan Note (Signed)
We will monitor off her medications for now she will continue her Xanax low dose as needed

## 2013-12-30 NOTE — Assessment & Plan Note (Signed)
Blood pressure well controlled Check fasting labs

## 2013-12-30 NOTE — Progress Notes (Signed)
Patient ID: Tiffany Flowers, female   DOB: [birth year], 58 y.o.   MRN: [id number]   Subjective:    Patient ID: Tiffany Flowers, female    DOB: 12-24-1955, 58 y.o.   MRN: [id number]  Patient presents for Pap and Medication review and refill  patient here for annual examination and Pap smear she last had a Pap smear in 2013 which was normal the exception of some atrophy. Her colonoscopy was done in 2013 will we do not have the report of this. She is due for a tetanus booster. Her mammogram was done yesterday. She's due for fasting labs she does have a history of hypertension and is currently on amlodipine. She's no specific concerns today is overall doing well. Of note she does have history of panic disorder and generalized anxiety she was tried on multiple medications she was last on Prozac but did not see any improvement therefore she stopped taking this she does take her Xanax almost daily to help her with driving. She's still on Tylenol #3 with codeine for her irritable bowel syndrome which has been chronic for her.    Review Of Systems:  GEN- denies fatigue, fever, weight loss,weakness, recent illness HEENT- denies eye drainage, change in vision, nasal discharge, CVS- denies chest pain, palpitations RESP- denies SOB, cough, wheeze ABD- denies N/V, change in stools, abd pain GU- denies dysuria, hematuria, dribbling, incontinence MSK- denies joint pain, muscle aches, injury Neuro- denies headache, dizziness, syncope, seizure activity       Objective:    BP 132/76  Pulse 72  Temp(Src) 98.4 F (36.9 C) (Oral)  Resp 14  Ht 5' (1.524 m)  Wt 112 lb (50.803 kg)  BMI 21.87 kg/m2 GEN- NAD, alert and oriented x3 HEENT- PERRL, EOMI, non injected sclera, pink conjunctiva, MMM, oropharynx clear Neck- Supple, no thyromegaly Breast- normal symmetry, no nipple inversion,no nipple drainage, no nodules or lumps felt Nodes- no axillary nodes CVS- RRR, no murmur RESP-CTAB GU- normal external  genitalia, vaginal mucosa pink and moist, cervix visualized no growth - atrophy at os, no blood form os, no discharge, no CMT, no ovarian masses, uterus normal size ABD-NABS,soft,NT,ND EXT- No edema Pulses- Radial, DP- 2+ Psych- normal affect and mood        Assessment & Plan:      Problem List Items Addressed This Visit   Routine general medical examination at a health care facility     Pap smear done her last one was in 2013 will set up for bone density test fasting labs tetanus booster given  Discussed calcium and vitamin D    Relevant Orders      CBC with Differential      Comprehensive metabolic panel      Lipid panel   Insomnia     Well controlled on restoril    IBS (irritable bowel syndrome)     Continue Tylenol #3 which helps with her IBS pain she's been on this for quite some time I will obtain her last colonoscopy    GAD (generalized anxiety disorder)     We will monitor off her medications for now she will continue her Xanax low dose as needed    Essential hypertension, benign     Blood pressure well controlled Check fasting labs    Relevant Medications      amLODIpine (NORVASC) tablet   Chronic pain syndrome - Primary   Relevant Medications      acetaminophen-codeine (TYLENOL #3) 300-30 MG per tablet  Other Visit Diagnoses   HTN (hypertension)        Relevant Orders       Lipid panel    Screening for malignant neoplasm of the cervix        Relevant Orders       PAP, ThinPrep ASCUS Rflx HPV Rflx Type    Encounter for vitamin deficiency screening        Relevant Orders       Vit D  25 hydroxy (rtn osteoporosis monitoring)    Screening for osteoporosis        Relevant Orders       DG Bone Density       Note: This dictation was prepared with Dragon dictation along with smaller phrase technology. Any transcriptional errors that result from this process are unintentional.

## 2013-12-31 LAB — PAP THINPREP ASCUS RFLX HPV RFLX TYPE

## 2014-01-01 ENCOUNTER — Encounter: Payer: Self-pay | Admitting: *Deleted

## 2014-01-14 ENCOUNTER — Other Ambulatory Visit: Payer: Self-pay | Admitting: Physician Assistant

## 2014-01-14 DIAGNOSIS — I10 Essential (primary) hypertension: Secondary | ICD-10-CM

## 2014-01-14 NOTE — Telephone Encounter (Signed)
Medication refilled per protocol. 

## 2014-04-08 ENCOUNTER — Other Ambulatory Visit: Payer: Self-pay | Admitting: Family Medicine

## 2014-04-09 NOTE — Telephone Encounter (Signed)
Ok to refill??  Last office visit/ refill 12/30/2013, #1 refill.

## 2014-04-10 NOTE — Telephone Encounter (Signed)
okay

## 2014-04-11 NOTE — Telephone Encounter (Signed)
Medication called to pharmacy. 

## 2014-06-14 ENCOUNTER — Other Ambulatory Visit: Payer: Self-pay | Admitting: Family Medicine

## 2014-06-15 ENCOUNTER — Encounter: Payer: Self-pay | Admitting: *Deleted

## 2014-06-15 NOTE — Telephone Encounter (Signed)
Okay to refill all meds 

## 2014-06-15 NOTE — Telephone Encounter (Signed)
Ok to refill??  Last office visit 12/30/2013.  Last refill Tylenol #3 04/11/2014.  Last refill Restoril 12/30/2013, #3 refills.   Appointment scheduled for 07/03/2014.

## 2014-06-15 NOTE — Telephone Encounter (Signed)
Medication called to pharmacy. 

## 2014-07-03 ENCOUNTER — Ambulatory Visit (INDEPENDENT_AMBULATORY_CARE_PROVIDER_SITE_OTHER): Payer: 59 | Admitting: Family Medicine

## 2014-07-03 ENCOUNTER — Encounter: Payer: Self-pay | Admitting: Family Medicine

## 2014-07-03 VITALS — BP 138/80 | HR 62 | Temp 98.6°F | Resp 18 | Ht 61.0 in | Wt 113.0 lb

## 2014-07-03 DIAGNOSIS — G4700 Insomnia, unspecified: Secondary | ICD-10-CM

## 2014-07-03 DIAGNOSIS — G894 Chronic pain syndrome: Secondary | ICD-10-CM

## 2014-07-03 DIAGNOSIS — Z23 Encounter for immunization: Secondary | ICD-10-CM

## 2014-07-03 DIAGNOSIS — K589 Irritable bowel syndrome without diarrhea: Secondary | ICD-10-CM

## 2014-07-03 DIAGNOSIS — F411 Generalized anxiety disorder: Secondary | ICD-10-CM

## 2014-07-03 MED ORDER — ALPRAZOLAM 0.25 MG PO TABS: ORAL_TABLET | ORAL | Status: DC

## 2014-07-03 MED ORDER — ACETAMINOPHEN-CODEINE #3 300-30 MG PO TABS: ORAL_TABLET | ORAL | Status: DC

## 2014-07-03 MED ORDER — TEMAZEPAM 30 MG PO CAPS: ORAL_CAPSULE | ORAL | Status: DC

## 2014-07-03 NOTE — Patient Instructions (Signed)
Restoril increased to 30mg  at bedtime Continue current medications Flu shot  F/U as needed

## 2014-07-04 ENCOUNTER — Encounter: Payer: Self-pay | Admitting: Family Medicine

## 2014-07-04 NOTE — Progress Notes (Signed)
Patient ID: Tiffany LamasSarah C Flowers, female   DOB: November 08, 1955, 58 y.o.   MRN: [id number]   Subjective:    Patient ID: Tiffany LamasSarah C Flowers, female    DOB: November 08, 1955, 58 y.o.   MRN: [id number]  Patient presents for Follow-up Pt here to f/u meds. Would like to increase restoril , with 1 tablet has to take tylenol pm with it, when the takes 2 tablets sleep well.   Continues to use xanax typically 1 a day, doing well with this.     IBS- uses tylenol #3 sparingly for her chronic abdominal pain and IBS.  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 due to the time it takes to get here approx 1 hour    Review Of Systems:  GEN- denies fatigue, fever, weight loss,weakness, recent illness HEENT- denies eye drainage, change in vision, nasal discharge, CVS- denies chest pain, palpitations RESP- denies SOB, cough, wheeze ABD- denies N/V, change in stools,+ abd pain GU- denies dysuria, hematuria, dribbling, incontinence MSK- denies joint pain, muscle aches, injury Neuro- denies headache, dizziness, syncope, seizure activity       Objective:    BP 138/80  Pulse 62  Temp(Src) 98.6 F (37 C) (Oral)  Resp 18  Ht 5\' 1"  (1.549 m)  Wt 113 lb (51.256 kg)  BMI 21.36 kg/m2 GEN- NAD, alert and oriented x3 CVS- RRR, no murmur RESP-CTAB ABD-NABS,soft,NT,ND Psych- normal affect and mood Pulses- Radial 2+        Assessment & Plan:      Problem List Items Addressed This Visit   None    Visit Diagnoses   Need for immunization against influenza    -  Primary    Relevant Orders       Flu Vaccine QUAD 36+ mos PF IM (Fluarix Quad PF) (Completed)       Note: This dictation was prepared with Dragon dictation along with smaller phrase technology. Any transcriptional errors that result from this process are unintentional.

## 2014-07-04 NOTE — Assessment & Plan Note (Signed)
Chronic insomnia, increase restoril to 30mg 

## 2014-07-04 NOTE — Assessment & Plan Note (Signed)
Continued on Tylenol #3 no sign of overuse or abuse

## 2014-07-04 NOTE — Assessment & Plan Note (Signed)
Xanax prn, was on prozac in the past

## 2014-07-07 ENCOUNTER — Telehealth: Payer: Self-pay | Admitting: *Deleted

## 2014-07-07 MED ORDER — AZITHROMYCIN 250 MG PO TABS: ORAL_TABLET | ORAL | Status: DC

## 2014-07-07 NOTE — Telephone Encounter (Signed)
Zpak sent to pt pharmacy and left voice message for instructions to take zpak.

## 2014-07-07 NOTE — Telephone Encounter (Signed)
Pt called stating was just here first of last week with IBS and now she c/o sinusitis and wants to know if you can prescribe her something for it?  Pt says can also call her office phone if does not answer and can leave message [phone] ext [phone]  [facility]

## 2014-07-07 NOTE — Telephone Encounter (Signed)
Have her take mucinex decongestant Send in zpak

## 2014-07-16 ENCOUNTER — Telehealth: Payer: Self-pay | Admitting: Physician Assistant

## 2014-07-16 NOTE — Telephone Encounter (Signed)
Call placed to patient. LMTRC.  

## 2014-07-16 NOTE — Telephone Encounter (Signed)
Patient is calling to let you know that she still has a sinus infection would like to know if we can call in another antibiotic for her  Please call her back at [phone] [facility]

## 2014-07-17 MED ORDER — AMOXICILLIN-POT CLAVULANATE 875-125 MG PO TABS: 1.0000 | ORAL_TABLET | Freq: Two times a day (BID) | ORAL | Status: DC

## 2014-07-17 NOTE — Telephone Encounter (Signed)
Call placed to patient and patient made aware.  

## 2014-07-17 NOTE — Telephone Encounter (Signed)
Send augmentin BID x 10 days, nasal saline rinse, mucinex OTC

## 2014-07-17 NOTE — Telephone Encounter (Signed)
Call placed to patient.   Reports that she continues to have nasal congestion and sore throat with generalized malaise.  Reports that she gets 2-3 sinus infections each year, and she knows this is what is going on now.   States that she completed Z-Pack, and no relief noted.   MD please advise.

## 2014-07-20 ENCOUNTER — Telehealth: Payer: Self-pay | Admitting: *Deleted

## 2014-07-20 MED ORDER — AMLODIPINE BESYLATE 5 MG PO TABS
5.0000 mg | ORAL_TABLET | Freq: Every day | ORAL | Status: DC
Start: 2014-07-20 — End: 2015-01-12

## 2014-07-20 NOTE — Telephone Encounter (Signed)
We cannot refill the Xanax and Restoril in the 90 day supply.  These will have to be the regular 30 day supplies.

## 2014-07-20 NOTE — Telephone Encounter (Signed)
Mail order refills for Amlodipine sent.  Xanax and Restoril denied.  We do not refill controlled medications for 90 days thru mail order.

## 2014-07-20 NOTE — Telephone Encounter (Signed)
Received fax requesting refill on Amlodipine, Xanax, and Restoril with 90 day supply.   MD please advise.

## 2014-07-28 ENCOUNTER — Telehealth: Payer: Self-pay | Admitting: Physician Assistant

## 2014-07-28 NOTE — Telephone Encounter (Signed)
Patient is calling to say that she is still sick after 2 antibiotics, would like know if we can call something else in for her  [phone] ext [phone]

## 2014-07-28 NOTE — Telephone Encounter (Signed)
Pt has been treated twice now for this "sinus infection" over the phone.  I called her and told her she will need to make an appointment to be seen for any further treatment.

## 2014-07-30 ENCOUNTER — Ambulatory Visit (INDEPENDENT_AMBULATORY_CARE_PROVIDER_SITE_OTHER): Payer: 59 | Admitting: Family Medicine

## 2014-07-30 VITALS — BP 110/70 | HR 90 | Temp 98.8°F | Resp 16 | Ht 61.0 in | Wt 110.0 lb

## 2014-07-30 DIAGNOSIS — J309 Allergic rhinitis, unspecified: Secondary | ICD-10-CM

## 2014-07-30 DIAGNOSIS — J329 Chronic sinusitis, unspecified: Secondary | ICD-10-CM

## 2014-07-30 MED ORDER — LEVOFLOXACIN 500 MG PO TABS: 500.0000 mg | ORAL_TABLET | Freq: Every day | ORAL | Status: DC

## 2014-07-30 MED ORDER — FLUTICASONE PROPIONATE 50 MCG/ACT NA SUSP: 2.0000 | Freq: Every day | NASAL | Status: DC

## 2014-07-30 NOTE — Patient Instructions (Signed)
Your symptoms may be due to allergies - 1st try the saline nasal spray 4-5 times per day, mucinex if needed, avoid decongestants for now, start Allegra or Zyrtec once per day and FLonase for allergies. If not improving in next 3-4 days, can start new antibiotic. Return to the clinic or go to the nearest emergency room if any of your symptoms worsen or new symptoms occur.   Sinusitis Sinusitis is redness, soreness, and inflammation of the paranasal sinuses. Paranasal sinuses are air pockets within the bones of your face (beneath the eyes, the middle of the forehead, or above the eyes). In healthy paranasal sinuses, mucus is able to drain out, and air is able to circulate through them by way of your nose. However, when your paranasal sinuses are inflamed, mucus and air can become trapped. This can allow bacteria and other germs to grow and cause infection. Sinusitis can develop quickly and last only a short time (acute) or continue over a long period (chronic). Sinusitis that lasts for more than 12 weeks is considered chronic.  CAUSES  Causes of sinusitis include:  Allergies.  Structural abnormalities, such as displacement of the cartilage that separates your nostrils (deviated septum), which can decrease the air flow through your nose and sinuses and affect sinus drainage.  Functional abnormalities, such as when the small hairs (cilia) that line your sinuses and help remove mucus do not work properly or are not present. SIGNS AND SYMPTOMS  Symptoms of acute and chronic sinusitis are the same. The primary symptoms are pain and pressure around the affected sinuses. Other symptoms include:  Upper toothache.  Earache.  Headache.  Bad breath.  Decreased sense of smell and taste.  A cough, which worsens when you are lying flat.  Fatigue.  Fever.  Thick drainage from your nose, which often is green and may contain pus (purulent).  Swelling and warmth over the affected sinuses. DIAGNOSIS    Your health care provider will perform a physical exam. During the exam, your health care provider may:  Look in your nose for signs of abnormal growths in your nostrils (nasal polyps).  Tap over the affected sinus to check for signs of infection.  View the inside of your sinuses (endoscopy) using an imaging device that has a light attached (endoscope). If your health care provider suspects that you have chronic sinusitis, one or more of the following tests may be recommended:  Allergy tests.  Nasal culture. A sample of mucus is taken from your nose, sent to a lab, and screened for bacteria.  Nasal cytology. A sample of mucus is taken from your nose and examined by your health care provider to determine if your sinusitis is related to an allergy. TREATMENT  Most cases of acute sinusitis are related to a viral infection and will resolve on their own within 10 days. Sometimes medicines are prescribed to help relieve symptoms (pain medicine, decongestants, nasal steroid sprays, or saline sprays).  However, for sinusitis related to a bacterial infection, your health care provider will prescribe antibiotic medicines. These are medicines that will help kill the bacteria causing the infection.  Rarely, sinusitis is caused by a fungal infection. In theses cases, your health care provider will prescribe antifungal medicine. For some cases of chronic sinusitis, surgery is needed. Generally, these are cases in which sinusitis recurs more than 3 times per year, despite other treatments. HOME CARE INSTRUCTIONS   Drink plenty of water. Water helps thin the mucus so your sinuses can drain more  easily.  Use a humidifier.  Inhale steam 3 to 4 times a day (for example, sit in the bathroom with the shower running).  Apply a warm, moist washcloth to your face 3 to 4 times a day, or as directed by your health care provider.  Use saline nasal sprays to help moisten and clean your sinuses.  Take medicines  only as directed by your health care provider.  If you were prescribed either an antibiotic or antifungal medicine, finish it all even if you start to feel better. SEEK IMMEDIATE MEDICAL CARE IF:  You have increasing pain or severe headaches.  You have nausea, vomiting, or drowsiness.  You have swelling around your face.  You have vision problems.  You have a stiff neck.  You have difficulty breathing. MAKE SURE YOU:   Understand these instructions.  Will watch your condition.  Will get help right away if you are not doing well or get worse. Document Released: 09/18/2005 Document Revised: 02/02/2014 Document Reviewed: 10/03/2011 [facility] Patient Information 2015 [place], [place]. This information is not intended to replace advice given to you by your health care provider. Make sure you discuss any questions you have with your health care provider.   Allergic Rhinitis Allergic rhinitis is when the mucous membranes in the nose respond to allergens. Allergens are particles in the air that cause your body to have an allergic reaction. This causes you to release allergic antibodies. Through a chain of events, these eventually cause you to release histamine into the blood stream. Although meant to protect the body, it is this release of histamine that causes your discomfort, such as frequent sneezing, congestion, and an itchy, runny nose.  CAUSES  Seasonal allergic rhinitis (hay fever) is caused by pollen allergens that may come from grasses, trees, and weeds. Year-round allergic rhinitis (perennial allergic rhinitis) is caused by allergens such as house dust mites, pet dander, and mold spores.  SYMPTOMS   Nasal stuffiness (congestion).  Itchy, runny nose with sneezing and tearing of the eyes. DIAGNOSIS  Your health care provider can help you determine the allergen or allergens that trigger your symptoms. If you and your health care provider are unable to determine the allergen, skin or  blood testing may be used. TREATMENT  Allergic rhinitis does not have a cure, but it can be controlled by:  Medicines and allergy shots (immunotherapy).  Avoiding the allergen. Hay fever may often be treated with antihistamines in pill or nasal spray forms. Antihistamines block the effects of histamine. There are over-the-counter medicines that may help with nasal congestion and swelling around the eyes. Check with your health care provider before taking or giving this medicine.  If avoiding the allergen or the medicine prescribed do not work, there are many new medicines your health care provider can prescribe. Stronger medicine may be used if initial measures are ineffective. Desensitizing injections can be used if medicine and avoidance does not work. Desensitization is when a patient is given ongoing shots until the body becomes less sensitive to the allergen. Make sure you follow up with your health care provider if problems continue. HOME CARE INSTRUCTIONS It is not possible to completely avoid allergens, but you can reduce your symptoms by taking steps to limit your exposure to them. It helps to know exactly what you are allergic to so that you can avoid your specific triggers. SEEK MEDICAL CARE IF:   You have a fever.  You develop a cough that does not stop easily (persistent).  You have shortness of breath.  You start wheezing.  Symptoms interfere with normal daily activities. Document Released: 06/13/2001 Document Revised: 09/23/2013 Document Reviewed: 05/26/2013 [facility] LLCExitCare Patient Information 2015 [place]Exit[place], [place]. This information is not intended to replace advice given to you by your health care provider. Make sure you discuss any questions you have with your health care provider.

## 2014-07-30 NOTE — Progress Notes (Signed)
Subjective:    Patient ID: Tiffany Flowers, female    DOB: [birth year], 58 y.o.   MRN: [id number]  HPI Tiffany Flowers is a 58 y.o. female  PCP: [facility]. Dr. Jeanice Lim  Hx of sinus infections - seen by her doctor October 2nd. Started feeling sick after flu shot that day.  Few days later - had sinus congestion and pressure for about 3 days, took Zpak, then not improving (sinus congestion, pressure and sore throat) after about a week - they called in Amoxicillin over the phone.  Did not have exam or OV at that time. No improvement with either antibiotic.  Seems to be getting worse - feels ok in the morning - then by around 11 - lethargic, head feels heavy - throat has some congestion? - feels like swollen glands.  Sinus congestion/pressure, but not blowing out any of the congestion (no discolored nasal d/c)., no fever. Slight cough, only in am, no other chest sx's.   Sinus infections in fall and spring. Allergy shots years ago. No current allergy symptoms.   Tx: phenylephrine/acetaminophen yesterday and mucinex D initially. Saline ns - 1-2x/day.    Patient Active Problem List   Diagnosis Date Noted  . Routine general medical examination at a health care facility 12/30/2013  . GAD (generalized anxiety disorder) 08/12/2013  . IBS (irritable bowel syndrome) 06/30/2013  . Panic disorder 12/20/2012  . Insomnia 12/20/2012  . Essential hypertension, benign 12/20/2012  . Overactive bladder 12/20/2012  . Chronic pain syndrome 12/20/2012   Past Medical History  Diagnosis Date  . Back pain     Bulging discs  . Vocal cord nodules     Removed  . Panic disorder    Past Surgical History  Procedure Laterality Date  . Cesarean section      x 2  . Tonsillectomy     Allergies  Allergen Reactions  . Wellbutrin [Bupropion] Other (See Comments)    Felt staggery   Prior to Admission medications   Medication Sig Start Date End Date Taking? Authorizing Provider    acetaminophen-codeine (TYLENOL #3) 300-30 MG per tablet TAKE 1 TABLET BY MOUTH EVERY 6 HOURS AS NEEDED 07/03/14   Salley Scarlet, MD  ALPRAZolam Prudy Feeler) 0.25 MG tablet TAKE 1 TABLET PO BID prn 07/03/14   Salley Scarlet, MD  amLODipine (NORVASC) 5 MG tablet Take 1 tablet (5 mg total) by mouth daily. 07/20/14   Patriciaann Clan Dixon, PA-C  calcium carbonate (OS-CAL) 600 MG TABS tablet Take 600 mg by mouth 2 (two) times daily with a meal.    Historical Provider, MD  Multiple Vitamin (MULTIVITAMIN) tablet Take 1 tablet by mouth daily.    Historical Provider, MD  temazepam (RESTORIL) 30 MG capsule TAKE ONE CAPSULE BY MOUTH AT BEDTIME AS NEEDED FOR SLEEP 07/03/14   Salley Scarlet, MD   History   Social History  . Marital Status: Married    Spouse Name: N/A    Number of Children: N/A  . Years of Education: N/A   Occupational History  . Not on file.   Social History Main Topics  . Smoking status: Current Every Day Smoker    Types: Cigarettes  . Smokeless tobacco: Never Used  . Alcohol Use: Yes  . Drug Use: No  . Sexual Activity: Not on file   Other Topics Concern  . Not on file   Social History Narrative  . No narrative on file    Review of Systems  Constitutional: Negative for fever and chills.  HENT: Positive for congestion, sinus pressure and sore throat (congestion feeling. ). Negative for sneezing.   Respiratory: Negative for cough.   Musculoskeletal: Positive for myalgias.       Objective:   Physical Exam  Vitals reviewed. Constitutional: She is oriented to person, place, and time. She appears well-developed and well-nourished. No distress.  HENT:  Head: Normocephalic and atraumatic.  Right Ear: Hearing, tympanic membrane, external ear and ear canal normal.  Left Ear: Hearing, tympanic membrane, external ear and ear canal normal.  Nose: Nose normal. Right sinus exhibits no maxillary sinus tenderness (not painful, but feels pressure. ) and no frontal sinus tenderness. Left  sinus exhibits no maxillary sinus tenderness (not painful, feels pressure. ) and no frontal sinus tenderness.  Mouth/Throat: Oropharynx is clear and moist. No oropharyngeal exudate.  Eyes: Conjunctivae and EOM are normal. Pupils are equal, round, and reactive to light.  Cardiovascular: Normal rate, regular rhythm, normal heart sounds and intact distal pulses.   No murmur heard. Pulmonary/Chest: Effort normal and breath sounds normal. No respiratory distress. She has no wheezes. She has no rhonchi.  Neurological: She is alert and oriented to person, place, and time.  Skin: Skin is warm and dry. No rash noted.  Psychiatric: She has a normal mood and affect. Her behavior is normal.   Filed Vitals:   07/30/14 0818  BP: 110/70  Pulse: 90  Temp: 98.8 F (37.1 C)  TempSrc: Oral  Resp: 16  Height: 5\' 1"  (1.549 m)  Weight: 110 lb (49.896 kg)  SpO2: 98%       Assessment & Plan:   Marta LamasSarah C Flowers is a 58 y.o. female Recurrent sinusitis, unspecified chronicity, unspecified location - Plan: levofloxacin (LEVAQUIN) 500 MG tablet  Allergic rhinitis, unspecified allergic rhinitis type S/p 2 separate antibiotics after initial sinus congestion that may have been viral or allergy related, and with persistence of sx's - suspected allergic (as sx's in spring and fall in past).  Possible sinusitis that has persisted.   -start flonase, allegra or zyrtec, mucinex and saline ns  -if not improving - fill Levaquin. SED, cautioned about C Diff with repeat abx's and tendon risks discussed.   -RTC/ER precautions.   Meds ordered this encounter  Medications  . fluticasone (FLONASE) 50 MCG/ACT nasal spray    Sig: Place 2 sprays into both nostrils daily.    Dispense:  16 g    Refill:  2  . levofloxacin (LEVAQUIN) 500 MG tablet    Sig: Take 1 tablet (500 mg total) by mouth daily.    Dispense:  10 tablet    Refill:  0   Patient Instructions  Your symptoms may be due to allergies - 1st try the saline nasal  spray 4-5 times per day, mucinex if needed, avoid decongestants for now, start Allegra or Zyrtec once per day and FLonase for allergies. If not improving in next 3-4 days, can start new antibiotic. Return to the clinic or go to the nearest emergency room if any of your symptoms worsen or new symptoms occur.   Sinusitis Sinusitis is redness, soreness, and inflammation of the paranasal sinuses. Paranasal sinuses are air pockets within the bones of your face (beneath the eyes, the middle of the forehead, or above the eyes). In healthy paranasal sinuses, mucus is able to drain out, and air is able to circulate through them by way of your nose. However, when your paranasal sinuses are inflamed, mucus and air can  become trapped. This can allow bacteria and other germs to grow and cause infection. Sinusitis can develop quickly and last only a short time (acute) or continue over a long period (chronic). Sinusitis that lasts for more than 12 weeks is considered chronic.  CAUSES  Causes of sinusitis include:  Allergies.  Structural abnormalities, such as displacement of the cartilage that separates your nostrils (deviated septum), which can decrease the air flow through your nose and sinuses and affect sinus drainage.  Functional abnormalities, such as when the small hairs (cilia) that line your sinuses and help remove mucus do not work properly or are not present. SIGNS AND SYMPTOMS  Symptoms of acute and chronic sinusitis are the same. The primary symptoms are pain and pressure around the affected sinuses. Other symptoms include:  Upper toothache.  Earache.  Headache.  Bad breath.  Decreased sense of smell and taste.  A cough, which worsens when you are lying flat.  Fatigue.  Fever.  Thick drainage from your nose, which often is green and may contain pus (purulent).  Swelling and warmth over the affected sinuses. DIAGNOSIS  Your health care provider will perform a physical exam. During  the exam, your health care provider may:  Look in your nose for signs of abnormal growths in your nostrils (nasal polyps).  Tap over the affected sinus to check for signs of infection.  View the inside of your sinuses (endoscopy) using an imaging device that has a light attached (endoscope). If your health care provider suspects that you have chronic sinusitis, one or more of the following tests may be recommended:  Allergy tests.  Nasal culture. A sample of mucus is taken from your nose, sent to a lab, and screened for bacteria.  Nasal cytology. A sample of mucus is taken from your nose and examined by your health care provider to determine if your sinusitis is related to an allergy. TREATMENT  Most cases of acute sinusitis are related to a viral infection and will resolve on their own within 10 days. Sometimes medicines are prescribed to help relieve symptoms (pain medicine, decongestants, nasal steroid sprays, or saline sprays).  However, for sinusitis related to a bacterial infection, your health care provider will prescribe antibiotic medicines. These are medicines that will help kill the bacteria causing the infection.  Rarely, sinusitis is caused by a fungal infection. In theses cases, your health care provider will prescribe antifungal medicine. For some cases of chronic sinusitis, surgery is needed. Generally, these are cases in which sinusitis recurs more than 3 times per year, despite other treatments. HOME CARE INSTRUCTIONS   Drink plenty of water. Water helps thin the mucus so your sinuses can drain more easily.  Use a humidifier.  Inhale steam 3 to 4 times a day (for example, sit in the bathroom with the shower running).  Apply a warm, moist washcloth to your face 3 to 4 times a day, or as directed by your health care provider.  Use saline nasal sprays to help moisten and clean your sinuses.  Take medicines only as directed by your health care provider.  If you were  prescribed either an antibiotic or antifungal medicine, finish it all even if you start to feel better. SEEK IMMEDIATE MEDICAL CARE IF:  You have increasing pain or severe headaches.  You have nausea, vomiting, or drowsiness.  You have swelling around your face.  You have vision problems.  You have a stiff neck.  You have difficulty breathing. MAKE SURE YOU:  Understand these instructions.  Will watch your condition.  Will get help right away if you are not doing well or get worse. Document Released: 09/18/2005 Document Revised: 02/02/2014 Document Reviewed: 10/03/2011 [facility] CenterExitCare Patient Information 2015 [place], [place]. This information is not intended to replace advice given to you by your health care provider. Make sure you discuss any questions you have with your health care provider.   Allergic Rhinitis Allergic rhinitis is when the mucous membranes in the nose respond to allergens. Allergens are particles in the air that cause your body to have an allergic reaction. This causes you to release allergic antibodies. Through a chain of events, these eventually cause you to release histamine into the blood stream. Although meant to protect the body, it is this release of histamine that causes your discomfort, such as frequent sneezing, congestion, and an itchy, runny nose.  CAUSES  Seasonal allergic rhinitis (hay fever) is caused by pollen allergens that may come from grasses, trees, and weeds. Year-round allergic rhinitis (perennial allergic rhinitis) is caused by allergens such as house dust mites, pet dander, and mold spores.  SYMPTOMS   Nasal stuffiness (congestion).  Itchy, runny nose with sneezing and tearing of the eyes. DIAGNOSIS  Your health care provider can help you determine the allergen or allergens that trigger your symptoms. If you and your health care provider are unable to determine the allergen, skin or blood testing may be used. TREATMENT  Allergic rhinitis  does not have a cure, but it can be controlled by:  Medicines and allergy shots (immunotherapy).  Avoiding the allergen. Hay fever may often be treated with antihistamines in pill or nasal spray forms. Antihistamines block the effects of histamine. There are over-the-counter medicines that may help with nasal congestion and swelling around the eyes. Check with your health care provider before taking or giving this medicine.  If avoiding the allergen or the medicine prescribed do not work, there are many new medicines your health care provider can prescribe. Stronger medicine may be used if initial measures are ineffective. Desensitizing injections can be used if medicine and avoidance does not work. Desensitization is when a patient is given ongoing shots until the body becomes less sensitive to the allergen. Make sure you follow up with your health care provider if problems continue. HOME CARE INSTRUCTIONS It is not possible to completely avoid allergens, but you can reduce your symptoms by taking steps to limit your exposure to them. It helps to know exactly what you are allergic to so that you can avoid your specific triggers. SEEK MEDICAL CARE IF:   You have a fever.  You develop a cough that does not stop easily (persistent).  You have shortness of breath.  You start wheezing.  Symptoms interfere with normal daily activities. Document Released: 06/13/2001 Document Revised: 09/23/2013 Document Reviewed: 05/26/2013 [facility]Care Patient Information 2015 [place], [place]. This information is not intended to replace advice given to you by your health care provider. Make sure you discuss any questions you have with your health care provider.

## 2014-08-11 ENCOUNTER — Other Ambulatory Visit: Payer: Self-pay | Admitting: *Deleted

## 2014-08-11 NOTE — Telephone Encounter (Signed)
Received fax requesting refill on temazepam and Xanax with 90 day supply.   MD does not given 90 day supply of controlled substances.   Refill denied.   Patient made aware.

## 2014-08-26 ENCOUNTER — Telehealth: Payer: Self-pay | Admitting: Physician Assistant

## 2014-08-26 NOTE — Telephone Encounter (Signed)
[facility] Pt is needing any of her rx that needs to be refilled to be filled  Call back number is [phone] or [phone] (work number)

## 2014-09-01 ENCOUNTER — Other Ambulatory Visit: Payer: Self-pay | Admitting: Family Medicine

## 2014-09-01 NOTE — Telephone Encounter (Signed)
Ok to refill??  Last office visit/ refill 07/03/2014. 

## 2014-09-01 NOTE — Telephone Encounter (Signed)
Pt has been told she needs to tell us what meds she needs

## 2014-09-02 NOTE — Telephone Encounter (Signed)
Her last 3 office visits at our office have been with Dr. Jeanice Limurham. Will forward to Dr. Jeanice Limurham.

## 2014-09-02 NOTE — Telephone Encounter (Signed)
Last Rf 10/2 #60  Last OV 07/04/14  OK refill?

## 2014-09-02 NOTE — Telephone Encounter (Signed)
Okay 

## 2014-11-23 ENCOUNTER — Other Ambulatory Visit: Payer: Self-pay

## 2014-11-23 DIAGNOSIS — Z1231 Encounter for screening mammogram for malignant neoplasm of breast: Secondary | ICD-10-CM

## 2014-11-30 ENCOUNTER — Other Ambulatory Visit: Payer: Self-pay | Admitting: Family Medicine

## 2014-11-30 NOTE — Telephone Encounter (Signed)
Ok to refill??  Last office visit 07/30/2014.   Last refill 07/03/2014, #3 refills.

## 2014-12-01 NOTE — Telephone Encounter (Signed)
Approved # 30 + 3 additional refills.

## 2014-12-01 NOTE — Telephone Encounter (Signed)
RX called in .

## 2014-12-07 ENCOUNTER — Other Ambulatory Visit: Payer: Self-pay | Admitting: Family Medicine

## 2014-12-07 NOTE — Telephone Encounter (Signed)
LRF 09/02/14 #60  LOV 07/03/14.  OK refill?

## 2014-12-07 NOTE — Telephone Encounter (Signed)
rx called in

## 2014-12-07 NOTE — Telephone Encounter (Signed)
Okay to refill? 

## 2015-01-01 ENCOUNTER — Ambulatory Visit (INDEPENDENT_AMBULATORY_CARE_PROVIDER_SITE_OTHER): Payer: 59 | Admitting: Family Medicine

## 2015-01-01 ENCOUNTER — Encounter: Payer: Self-pay | Admitting: Family Medicine

## 2015-01-01 VITALS — BP 132/74 | HR 74 | Temp 98.6°F | Resp 16 | Ht 60.75 in | Wt 107.2 lb

## 2015-01-01 DIAGNOSIS — Z5320 Procedure and treatment not carried out because of patient's decision for unspecified reasons: Secondary | ICD-10-CM

## 2015-01-06 NOTE — Progress Notes (Signed)
Tiffany Flowers is a 59 yo woman who has been seen periodically in our urgent care for many years along with her family.  For the past >20 years she has had the same PCP in [place] but he retired and our office is much closer so when she was seen at the [facility] clinic 5 months ago for a sinus infection by my colleague Dr. Neva Seat, she decided to schedule an appointment and transfer her routine medical care here as she reports that we were constantly telling her that we wanted to be her primary care provider at all of her prior visits ere.  She reports she has had this visit with me today scheduled for months and that she had paid a fee to get her records transferred here a while ago. Unfortunately, pt was put in for just a 15 minute time slot at 4 pm on a Friday - at this point, I was running about 90 minutes behind and pt was understandable upset as she was put in a room and states she was not informed when I would get to her. She felt that the CMAs were rude and uninformative when she questioned how long I would be.  Unfortunately, both the patient and I felt that she was not allotted an appropriate amount of time to establish care considering her multiple chronic illnesses and varied medication list in this 59 yo woman. She also very upset that I do not have her records.  We do have Epic records available from her PCP over the past year but she seems very sure that there are >20 years of ALL of her records that she had sent here and is upset that I don't have them and have not acquainted myself with them.  I tried to explain to pt that as I had not seen her prior, her records may be sitting in my box or the box of one of my colleagues that she has seen prev or scanned - I don't know and do not have the opportunity to look for them now. She feels that since she had this visit sched for months that I should have reviewed her chart and be familiar with her medical history which I certainly had not had the  foresight to do.   Pt stated that she really just had to have 6 months of refills on her controlled medications though she also would like to discuss her HTN, anxiety, dysphagia, and a rash that she has had on the back of her neck for a year.  I informed pt that due to the time constraints I would be willing to provide her 3 months of refills on her controlled medications (temazepam, alprazolam, and tylenol #3) and then see her back in 3 months. Pt was very upset that I would not write her 6 months at this time. I informed patient that as we got to know each other and I became more familiar with her health history as well as more confident in our relationship that I would be willing to consider providing more refills at a visit but that with all new patients, and most established patients, I only feel comfortable prescribing controlled medicaitons for 3 months as there are concerns over the reasons these medications are controlled and that our governing bodies watch our prescribing practices - something that we are even more sensitive to since we have the urgent care aspect to our practice. Pt was very upset and dissatisfied with this plan.  Pt asked to see a different provider and pt was informed that she is welcome to see whomever she prefers. She can walk to the [facility] location now or schedule with a different provider.  This visit was voided, her copay was refunded.  Pt wanted her medical records back so I suggested to pt that she would have to discuss this with the clerical staff as I did not know where they were or what exactly she was wanting.

## 2015-01-11 ENCOUNTER — Ambulatory Visit
Admission: RE | Admit: 2015-01-11 | Discharge: 2015-01-11 | Disposition: A | Discharge status: Home / Self Care | Payer: 59 | Source: Ambulatory Visit

## 2015-01-11 DIAGNOSIS — Z1231 Encounter for screening mammogram for malignant neoplasm of breast: Secondary | ICD-10-CM

## 2015-01-12 ENCOUNTER — Encounter: Payer: Self-pay | Admitting: Family Medicine

## 2015-01-12 ENCOUNTER — Ambulatory Visit (INDEPENDENT_AMBULATORY_CARE_PROVIDER_SITE_OTHER): Payer: 59 | Admitting: Family Medicine

## 2015-01-12 VITALS — BP 118/70 | HR 74 | Temp 98.6°F | Resp 12 | Ht 61.0 in | Wt 107.0 lb

## 2015-01-12 DIAGNOSIS — G894 Chronic pain syndrome: Secondary | ICD-10-CM

## 2015-01-12 DIAGNOSIS — Z1321 Encounter for screening for nutritional disorder: Secondary | ICD-10-CM

## 2015-01-12 DIAGNOSIS — Z0000 Encounter for general adult medical examination without abnormal findings: Secondary | ICD-10-CM | POA: Diagnosis not present

## 2015-01-12 DIAGNOSIS — Z720 Tobacco use: Secondary | ICD-10-CM | POA: Diagnosis not present

## 2015-01-12 DIAGNOSIS — F17200 Nicotine dependence, unspecified, uncomplicated: Secondary | ICD-10-CM | POA: Insufficient documentation

## 2015-01-12 DIAGNOSIS — G4700 Insomnia, unspecified: Secondary | ICD-10-CM

## 2015-01-12 DIAGNOSIS — I10 Essential (primary) hypertension: Secondary | ICD-10-CM | POA: Diagnosis not present

## 2015-01-12 DIAGNOSIS — F411 Generalized anxiety disorder: Secondary | ICD-10-CM | POA: Diagnosis not present

## 2015-01-12 LAB — CBC WITH DIFFERENTIAL/PLATELET
BASOS PCT: 1 % (ref 0–1)
Basophils Absolute: 0.1 10*3/uL (ref 0.0–0.1)
EOS ABS: 0.1 10*3/uL (ref 0.0–0.7)
Eosinophils Relative: 2 % (ref 0–5)
HCT: 36.6 % (ref 36.0–46.0)
HEMOGLOBIN: 12.2 g/dL (ref 12.0–15.0)
LYMPHS ABS: 1.6 10*3/uL (ref 0.7–4.0)
Lymphocytes Relative: 31 % (ref 12–46)
MCH: 33.3 pg (ref 26.0–34.0)
MCHC: 33.3 g/dL (ref 30.0–36.0)
MCV: 100 fL (ref 78.0–100.0)
MPV: 9.3 fL (ref 8.6–12.4)
Monocytes Absolute: 0.4 10*3/uL (ref 0.1–1.0)
Monocytes Relative: 8 % (ref 3–12)
NEUTROS ABS: 2.9 10*3/uL (ref 1.7–7.7)
NEUTROS PCT: 58 % (ref 43–77)
Platelets: 277 10*3/uL (ref 150–400)
RBC: 3.66 MIL/uL — AB (ref 3.87–5.11)
RDW: 13 % (ref 11.5–15.5)
WBC: 5 10*3/uL (ref 4.0–10.5)

## 2015-01-12 LAB — COMPREHENSIVE METABOLIC PANEL
ALT: 9 U/L (ref 0–35)
AST: 18 U/L (ref 0–37)
Albumin: 4.5 g/dL (ref 3.5–5.2)
Alkaline Phosphatase: 52 U/L (ref 39–117)
BUN: 5 mg/dL — ABNORMAL LOW (ref 6–23)
CO2: 25 meq/L (ref 19–32)
CREATININE: 0.56 mg/dL (ref 0.50–1.10)
Calcium: 9.4 mg/dL (ref 8.4–10.5)
Chloride: 96 mEq/L (ref 96–112)
GLUCOSE: 81 mg/dL (ref 70–99)
Potassium: 4.5 mEq/L (ref 3.5–5.3)
Sodium: 131 mEq/L — ABNORMAL LOW (ref 135–145)
TOTAL PROTEIN: 6.9 g/dL (ref 6.0–8.3)
Total Bilirubin: 0.6 mg/dL (ref 0.2–1.2)

## 2015-01-12 LAB — LIPID PANEL
Cholesterol: 188 mg/dL (ref 0–200)
HDL: 94 mg/dL (ref >=46)
LDL Cholesterol: 73 mg/dL (ref 0–99)
Total CHOL/HDL Ratio: 2 Ratio
Triglycerides: 106 mg/dL (ref <150)
VLDL: 21 mg/dL (ref 0–40)

## 2015-01-12 MED ORDER — ACETAMINOPHEN-CODEINE #3 300-30 MG PO TABS: 1.0000 | ORAL_TABLET | Freq: Four times a day (QID) | ORAL | Status: DC | PRN

## 2015-01-12 MED ORDER — AMLODIPINE BESYLATE 5 MG PO TABS: 5.0000 mg | ORAL_TABLET | Freq: Every day | ORAL | Status: DC

## 2015-01-12 MED ORDER — TEMAZEPAM 30 MG PO CAPS: ORAL_CAPSULE | ORAL | Status: DC

## 2015-01-12 MED ORDER — ALPRAZOLAM 0.25 MG PO TABS: ORAL_TABLET | ORAL | Status: DC

## 2015-01-12 NOTE — Progress Notes (Signed)
Patient ID: Tiffany LamasSarah C Flowers, female   DOB: 02/29/1956, 59 y.o.   MRN: [id number]   Subjective:    Patient ID: Tiffany LamasSarah C Flowers, female    DOB: 02/29/1956, 59 y.o.   MRN: [id number]  Patient presents for CPE- no PAP  patient here for complete physical exam. She has no particular concerns. She is taking her medication as prescribed her anxiety and her chronic abdominal pain secondary to ear bowel syndrome. She is sleeping fairly well with her routine of temazepam she also takes Tylenol PM as needed. She had a Pap smear last year which was normal she is up-to-date otherwise on her screenings. Her immunizations are also up-to-date. She had her mammogram done yesterday she is still due for bone density. She is seeing her dentist and her eye doctor with no particular changes    Review Of Systems:  GEN- denies fatigue, fever, weight loss,weakness, recent illness HEENT- denies eye drainage, change in vision, nasal discharge, CVS- denies chest pain, palpitations RESP- denies SOB, cough, wheeze ABD- denies N/V, change in stools, abd pain GU- denies dysuria, hematuria, dribbling, incontinence MSK- denies joint pain, muscle aches, injury Neuro- denies headache, dizziness, syncope, seizure activity       Objective:    BP 118/70 mmHg  Pulse 74  Temp(Src) 98.6 F (37 C) (Oral)  Resp 12  Ht 5\' 1"  (1.549 m)  Wt 107 lb (48.535 kg)  BMI 20.23 kg/m2 GEN- NAD, alert and oriented x3 HEENT- PERRL, EOMI, non injected sclera, pink conjunctiva, MMM, oropharynx clear Neck- Supple, no thyromegaly CVS- RRR, no murmur RESP-CTAB ABD-NABS,soft,NT,ND Psych- normal affect and mood EXT- No edema Pulses- Radial, DP- 2+        Assessment & Plan:      Problem List Items Addressed This Visit      Unprioritized   Routine general medical examination at a health care facility - Primary    CPE done. Pap smear is up-to-date she does not need one until 2018. Colonoscopy up-to-date. Mammogram done yesterday was  normal. She will schedule her bone density. Fasting labs done today She has lost a little bit of weight because of her irritable bowel she does not eat on a regular basis. She does try to stay hydrated and drinks things like almonds will supplement her. I advised her to add protein to her arm and milk to give her more nutrients.      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 Any transcriptional errors that result from this process are  unintentional.

## 2015-01-12 NOTE — Assessment & Plan Note (Signed)
CPE done. Pap smear is up-to-date she does not need one until 2018. Colonoscopy up-to-date. Mammogram done yesterday was normal. She will schedule her bone density. Fasting labs done today She has lost a little bit of weight because of her irritable bowel she does not eat on a regular basis. She does try to stay hydrated and drinks things like almonds will supplement her. I advised her to add protein to her arm and milk to give her more nutrients.

## 2015-01-12 NOTE — Assessment & Plan Note (Signed)
Continue with Tylenol 3 with codeine she uses small amounts for her chronic irritable bowel syndrome and chronic abdominal pain

## 2015-01-12 NOTE — Assessment & Plan Note (Signed)
Continue temazepam she also uses Tylenol PM so that she does not take the temazepam every night

## 2015-01-12 NOTE — Patient Instructions (Addendum)
Add protein to your almond milk Continue current medications We will call with lab results  Schedule your bone density F/U 6 months

## 2015-01-12 NOTE — Assessment & Plan Note (Signed)
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

## 2015-01-12 NOTE — Addendum Note (Signed)
Addended by: Milinda AntisURHAM, Armonie Staten F on: 01/12/2015 08:44 AM   Modules accepted: Level of Service

## 2015-01-12 NOTE — Assessment & Plan Note (Signed)
Blood pressure well controlled

## 2015-01-13 LAB — VITAMIN D 25 HYDROXY (VIT D DEFICIENCY, FRACTURES): VIT D 25 HYDROXY: 59 ng/mL (ref 30–100)

## 2015-01-14 ENCOUNTER — Encounter: Payer: Self-pay | Admitting: *Deleted

## 2015-03-10 ENCOUNTER — Other Ambulatory Visit: Payer: Self-pay | Admitting: Family Medicine

## 2015-03-10 NOTE — Telephone Encounter (Signed)
Approved for # 60 + 0 

## 2015-03-10 NOTE — Telephone Encounter (Signed)
Ok to refill??  Last office visit/ refill 01/12/2015. 

## 2015-03-11 NOTE — Telephone Encounter (Signed)
Medication called to pharmacy. 

## 2015-05-17 ENCOUNTER — Other Ambulatory Visit: Payer: Self-pay | Admitting: Family Medicine

## 2015-05-17 NOTE — Telephone Encounter (Signed)
Approved # 60 + 2 

## 2015-05-17 NOTE — Telephone Encounter (Signed)
Medication refilled per protocol. 

## 2015-05-17 NOTE — Telephone Encounter (Signed)
Ok to refill??  Last office visit 01/12/2015.  Last refill 03/11/2015.

## 2015-05-21 ENCOUNTER — Telehealth: Payer: Self-pay | Admitting: Physician Assistant

## 2015-05-21 NOTE — Telephone Encounter (Signed)
  is the maxmium, she can not take 2 tablets, this is too much of the medication She can try adding melatonin  along with this instead

## 2015-05-21 NOTE — Telephone Encounter (Signed)
Patient states that she is having trouble sleeping and that she has started taking 2 of her temazepam. She would like to know if she could get a higher dosage next time or is it okay that she is taking 2 pills. Please advise.

## 2015-06-21 ENCOUNTER — Other Ambulatory Visit: Payer: Self-pay | Admitting: Family Medicine

## 2015-06-21 NOTE — Telephone Encounter (Signed)
Restoril called to pharmacy.

## 2015-06-21 NOTE — Telephone Encounter (Signed)
Per chart notes, Tylenol #3 called in on 05/17/2015 with #2 refills. Call placed to pharmacy. Was advised that old prescription number was requested, but patient does have refills.   Ok to refill Restoril??  Last office visit/ refill 01/12/2015, #3 refills.

## 2015-06-21 NOTE — Telephone Encounter (Signed)
ok 

## 2015-07-16 ENCOUNTER — Encounter: Payer: Self-pay | Admitting: Family Medicine

## 2015-07-16 ENCOUNTER — Ambulatory Visit (INDEPENDENT_AMBULATORY_CARE_PROVIDER_SITE_OTHER): Payer: 59 | Admitting: Family Medicine

## 2015-07-16 VITALS — BP 128/62 | HR 70 | Temp 98.4°F | Resp 14 | Ht 61.0 in | Wt 105.0 lb

## 2015-07-16 DIAGNOSIS — F17200 Nicotine dependence, unspecified, uncomplicated: Secondary | ICD-10-CM

## 2015-07-16 DIAGNOSIS — K589 Irritable bowel syndrome without diarrhea: Secondary | ICD-10-CM

## 2015-07-16 DIAGNOSIS — J0100 Acute maxillary sinusitis, unspecified: Secondary | ICD-10-CM

## 2015-07-16 DIAGNOSIS — E46 Unspecified protein-calorie malnutrition: Secondary | ICD-10-CM

## 2015-07-16 DIAGNOSIS — G894 Chronic pain syndrome: Secondary | ICD-10-CM | POA: Diagnosis not present

## 2015-07-16 DIAGNOSIS — I10 Essential (primary) hypertension: Secondary | ICD-10-CM

## 2015-07-16 DIAGNOSIS — F411 Generalized anxiety disorder: Secondary | ICD-10-CM | POA: Diagnosis not present

## 2015-07-16 DIAGNOSIS — Z23 Encounter for immunization: Secondary | ICD-10-CM

## 2015-07-16 LAB — CBC WITH DIFFERENTIAL/PLATELET
Basophils Absolute: 0.1 10*3/uL (ref 0.0–0.1)
Basophils Relative: 1 % (ref 0–1)
Eosinophils Absolute: 0.1 10*3/uL (ref 0.0–0.7)
Eosinophils Relative: 1 % (ref 0–5)
HCT: 37.6 % (ref 36.0–46.0)
HEMOGLOBIN: 12.5 g/dL (ref 12.0–15.0)
LYMPHS PCT: 29 % (ref 12–46)
Lymphs Abs: 1.5 10*3/uL (ref 0.7–4.0)
MCH: 33.7 pg (ref 26.0–34.0)
MCHC: 33.2 g/dL (ref 30.0–36.0)
MCV: 101.3 fL — ABNORMAL HIGH (ref 78.0–100.0)
MPV: 9.7 fL (ref 8.6–12.4)
Monocytes Absolute: 0.5 10*3/uL (ref 0.1–1.0)
Monocytes Relative: 10 % (ref 3–12)
NEUTROS ABS: 3.1 10*3/uL (ref 1.7–7.7)
NEUTROS PCT: 59 % (ref 43–77)
Platelets: 259 10*3/uL (ref 150–400)
RBC: 3.71 MIL/uL — AB (ref 3.87–5.11)
RDW: 12.7 % (ref 11.5–15.5)
WBC: 5.3 10*3/uL (ref 4.0–10.5)

## 2015-07-16 LAB — COMPREHENSIVE METABOLIC PANEL
ALBUMIN: 4.5 g/dL (ref 3.6–5.1)
ALT: 8 U/L (ref 6–29)
AST: 16 U/L (ref 10–35)
Alkaline Phosphatase: 62 U/L (ref 33–130)
BILIRUBIN TOTAL: 0.4 mg/dL (ref 0.2–1.2)
BUN: 8 mg/dL (ref 7–25)
CO2: 25 mmol/L (ref 20–31)
Calcium: 9.8 mg/dL (ref 8.6–10.4)
Chloride: 98 mmol/L (ref 98–110)
Creat: 0.58 mg/dL (ref 0.50–1.05)
Glucose, Bld: 81 mg/dL (ref 70–99)
Potassium: 4.1 mmol/L (ref 3.5–5.3)
Sodium: 134 mmol/L — ABNORMAL LOW (ref 135–146)
TOTAL PROTEIN: 6.9 g/dL (ref 6.1–8.1)

## 2015-07-16 LAB — LIPID PANEL
CHOLESTEROL: 180 mg/dL (ref 125–200)
HDL: 96 mg/dL (ref >=46)
LDL CALC: 68 mg/dL (ref <130)
TRIGLYCERIDES: 82 mg/dL (ref <150)
Total CHOL/HDL Ratio: 1.9 Ratio (ref <=5.0)
VLDL: 16 mg/dL (ref <30)

## 2015-07-16 LAB — TSH: TSH: 2.073 u[IU]/mL (ref 0.350–4.500)

## 2015-07-16 LAB — T4, FREE: Free T4: 0.9 ng/dL (ref 0.80–1.80)

## 2015-07-16 LAB — T3, FREE: T3 FREE: 2.8 pg/mL (ref 2.3–4.2)

## 2015-07-16 MED ORDER — AMOXICILLIN 875 MG PO TABS: 875.0000 mg | ORAL_TABLET | Freq: Two times a day (BID) | ORAL | Status: DC

## 2015-07-16 MED ORDER — ACETAMINOPHEN-CODEINE #3 300-30 MG PO TABS: 1.0000 | ORAL_TABLET | Freq: Four times a day (QID) | ORAL | Status: DC | PRN

## 2015-07-16 MED ORDER — ELUXADOLINE 75 MG PO TABS: 1.0000 | ORAL_TABLET | Freq: Two times a day (BID) | ORAL | Status: DC

## 2015-07-16 MED ORDER — ALPRAZOLAM 0.5 MG PO TABS: 0.5000 mg | ORAL_TABLET | Freq: Every evening | ORAL | Status: DC | PRN

## 2015-07-16 NOTE — Assessment & Plan Note (Signed)
Refilled tylenol 3

## 2015-07-16 NOTE — Assessment & Plan Note (Signed)
Evaluated multiple times by GI,I think there is a big psychiatric and anxiety component to her abdominal pain and eating now, classic IBS pain with eating diarrhea directly after, she avoids eating because of this and weight continues to suffer. I am concerned she is malnourished. She agrees to try Viberzi, given samples of this

## 2015-07-16 NOTE — Addendum Note (Signed)
Addended by: Phillips OdorSIX, Delaina Fetsch H on: 07/16/2015 09:10 AM   Modules accepted: Orders

## 2015-07-16 NOTE — Assessment & Plan Note (Signed)
Followed by psychiatry now, on effexor, and xanax still, remeron for sleep

## 2015-07-16 NOTE — Patient Instructions (Addendum)
Try the Viberzi twice a day  Continue all other medication Flu shot given We will call with lab results F/U 6 months- Physical

## 2015-07-16 NOTE — Assessment & Plan Note (Signed)
Well controlled, no change to meds 

## 2015-07-16 NOTE — Progress Notes (Signed)
Patient ID: Tiffany LamasSarah C Selway, female   DOB: 01-27-1956, 59 y.o.   MRN: [id number]   Subjective:    Patient ID: Tiffany LamasSarah C Forbush, female    DOB: 01-27-1956, 59 y.o.   MRN: [id number]  Patient presents for 6 month F/U GAD- followed by Dr. Toni ArthursFuller now, started on effexor, xanax also increased to 0.5mg , needs fasting labs today, orders from him written. She has been on meds since August, feels better, increased anxiety from Father passing and dealing with estate.  IBS- continues to have chronic abdominal pain, loose stools and cramping with meals, avoid eating at work, has coffee and almond milk in the morning and then eats a small meal when she gets home, weight down another 2lbs in past 6 months. Continues to use Tylenol #3 for pain   Sinus pressure and drainage for past few weeks, took 3 days of amoxicillin, using nasal saline and mucous relief. Gets Sinus infections every year No fever, no significant cough  Review Of Systems: per above   GEN- denies fatigue, fever, weight loss,weakness, recent illness HEENT- denies eye drainage, change in vision, +nasal discharge, CVS- denies chest pain, palpitations RESP- denies SOB, cough, wheeze ABD- denies N/V, change in stools, +abd pain GU- denies dysuria, hematuria, dribbling, incontinence MSK- denies joint pain, muscle aches, injury Neuro- denies headache, dizziness, syncope, seizure activity       Objective:    BP 128/62 mmHg  Pulse 70  Temp(Src) 98.4 F (36.9 C) (Oral)  Resp 14  Ht 5\' 1"  (1.549 m)  Wt 105 lb (47.628 kg)  BMI 19.85 kg/m2 GEN- NAD, alert and oriented x3 HEENT- PERRL, EOMI, non injected sclera, pink conjunctiva, MMM, oropharynx clear, Nares rhinorhea, enlarged turbinates, TTP maxillary sinus ,TM clear bilat  Neck- Supple, no thyromegaly CVS- RRR, no murmur RESP-CTAB Psych- normal affect and mood EXT- No edema         Assessment & Plan:      Problem List Items Addressed This Visit    Tobacco use disorder - Primary    Protein malnutrition (HCC)   IBS (irritable bowel syndrome)    Evaluated multiple times by GI,I think there is a big psychiatric and anxiety component to her abdominal pain and eating now, classic IBS pain with eating diarrhea directly after, she avoids eating because of this and weight continues to suffer. I am concerned she is malnourished. She agrees to try Viberzi, given samples of this      Relevant Medications   Eluxadoline (VIBERZI) 75 MG TABS   GAD (generalized anxiety disorder)    Followed by psychiatry now, on effexor, and xanax still, remeron for sleep      Essential hypertension, benign    Well controlled, no change to meds      Relevant Orders   CBC with Differential/Platelet   Comprehensive metabolic panel   Lipid panel   TSH   T3, free   T4, free   Chronic pain syndrome    Refilled tylenol #3       Other Visit Diagnoses    Acute maxillary sinusitis, recurrence not specified        RX, amox x 7 more days, continue nasal saline rinse     Relevant Medications    amoxicillin (AMOXIL) 875 MG tablet       Note: This dictation was prepared with Dragon dictation along with smaller phrase technology. Any transcriptional errors that result from this process are unintentional.

## 2015-07-19 ENCOUNTER — Other Ambulatory Visit: Payer: Self-pay | Admitting: Family Medicine

## 2015-07-19 NOTE — Telephone Encounter (Signed)
Refill appropriate and filled per protocol. 

## 2015-07-29 ENCOUNTER — Telehealth: Payer: Self-pay | Admitting: Family Medicine

## 2015-07-29 NOTE — Telephone Encounter (Signed)
Call placed to patient.   States that she continues to have increased congestion in head, sore throat and fever.   Reports that she has completed Amoxicillin with no relief.   MD please advise.

## 2015-07-29 NOTE — Telephone Encounter (Signed)
To MD

## 2015-07-29 NOTE — Telephone Encounter (Signed)
Patient would like a call back regarding still being sick  [phone]

## 2015-07-29 NOTE — Telephone Encounter (Signed)
I would defer to Dr. Algis Downs tomorrow

## 2015-07-30 MED ORDER — AMOXICILLIN-POT CLAVULANATE 875-125 MG PO TABS: 1.0000 | ORAL_TABLET | Freq: Two times a day (BID) | ORAL | Status: DC

## 2015-07-30 MED ORDER — PREDNISONE 20 MG PO TABS: 20.0000 mg | ORAL_TABLET | Freq: Every day | ORAL | Status: DC

## 2015-07-30 NOTE — Telephone Encounter (Signed)
Send in Augmentin x 10 days Send prednisone 20mg  once a day for 5 days

## 2015-07-30 NOTE — Telephone Encounter (Signed)
Prescription sent to pharmacy.   Call placed to patient. LMTRC.  

## 2015-08-02 NOTE — Telephone Encounter (Signed)
Call placed to patient and patient made aware.  

## 2015-11-01 ENCOUNTER — Telehealth: Payer: Self-pay | Admitting: Family Medicine

## 2015-11-01 MED ORDER — VENLAFAXINE HCL ER 37.5 MG PO CP24
37.5000 mg | ORAL_CAPSULE | Freq: Every day | ORAL | Status: DC
Start: 2015-11-01 — End: 2016-01-25

## 2015-11-01 NOTE — Telephone Encounter (Signed)
Ok to take over prescription? 

## 2015-11-01 NOTE — Telephone Encounter (Signed)
Pt is hoping that Dr. Jeanice Lim can start prescribing her Venlafaxine XR 37.5 mg 24 hr capsules. She has been seeing Dr. Toni Arthurs for her prescriptions, but it is costing her $45 per visit.  Please advise.  You can reach her @ [phone] ext [phone] She uses the [facility] on [address].

## 2015-11-01 NOTE — Telephone Encounter (Signed)
Okay to fill this medication

## 2015-11-01 NOTE — Telephone Encounter (Signed)
Prescription sent to pharmacy.   Call placed to patient. LMTRC.  

## 2015-11-02 ENCOUNTER — Other Ambulatory Visit: Payer: Self-pay | Admitting: Family Medicine

## 2015-11-02 NOTE — Telephone Encounter (Signed)
Medication called to pharmacy. 

## 2015-11-02 NOTE — Telephone Encounter (Signed)
Okay to refill? 

## 2015-11-02 NOTE — Telephone Encounter (Signed)
Ok to refill??  Last office visit 07/16/2015.  Last refill 06/21/2015, #3 refills.

## 2015-11-02 NOTE — Telephone Encounter (Signed)
Patient returned call and made aware.

## 2015-12-09 ENCOUNTER — Other Ambulatory Visit: Payer: Self-pay

## 2015-12-09 DIAGNOSIS — Z1231 Encounter for screening mammogram for malignant neoplasm of breast: Secondary | ICD-10-CM

## 2016-01-07 ENCOUNTER — Other Ambulatory Visit: Payer: Self-pay | Admitting: Family Medicine

## 2016-01-07 NOTE — Telephone Encounter (Signed)
Medication refilled per protocol. 

## 2016-01-21 ENCOUNTER — Encounter: Payer: 59 | Admitting: Family Medicine

## 2016-01-25 ENCOUNTER — Encounter: Payer: Self-pay | Admitting: Family Medicine

## 2016-01-25 ENCOUNTER — Ambulatory Visit (INDEPENDENT_AMBULATORY_CARE_PROVIDER_SITE_OTHER): Payer: 59 | Admitting: Family Medicine

## 2016-01-25 VITALS — BP 124/60 | HR 62 | Temp 98.9°F | Resp 14 | Ht 61.0 in | Wt 105.0 lb

## 2016-01-25 DIAGNOSIS — Z0000 Encounter for general adult medical examination without abnormal findings: Secondary | ICD-10-CM

## 2016-01-25 DIAGNOSIS — F17200 Nicotine dependence, unspecified, uncomplicated: Secondary | ICD-10-CM

## 2016-01-25 DIAGNOSIS — F411 Generalized anxiety disorder: Secondary | ICD-10-CM | POA: Diagnosis not present

## 2016-01-25 DIAGNOSIS — Z1159 Encounter for screening for other viral diseases: Secondary | ICD-10-CM

## 2016-01-25 DIAGNOSIS — G894 Chronic pain syndrome: Secondary | ICD-10-CM | POA: Diagnosis not present

## 2016-01-25 DIAGNOSIS — I10 Essential (primary) hypertension: Secondary | ICD-10-CM | POA: Diagnosis not present

## 2016-01-25 MED ORDER — FERROUS SULFATE 325 (65 FE) MG PO TABS: 325.0000 mg | ORAL_TABLET | Freq: Every day | ORAL | Status: AC

## 2016-01-25 MED ORDER — VENLAFAXINE HCL ER 150 MG PO CP24: 300.0000 mg | ORAL_CAPSULE | Freq: Every day | ORAL | Status: DC

## 2016-01-25 MED ORDER — VITAMIN D 1000 UNITS PO TABS: 1000.0000 [IU] | ORAL_TABLET | Freq: Every day | ORAL | Status: AC

## 2016-01-25 MED ORDER — ALPRAZOLAM 0.5 MG PO TABS: 0.5000 mg | ORAL_TABLET | Freq: Every evening | ORAL | Status: DC | PRN

## 2016-01-25 MED ORDER — ACETAMINOPHEN-CODEINE #3 300-30 MG PO TABS: 1.0000 | ORAL_TABLET | Freq: Four times a day (QID) | ORAL | Status: DC | PRN

## 2016-01-25 MED ORDER — AMLODIPINE BESYLATE 5 MG PO TABS: ORAL_TABLET | ORAL | Status: DC

## 2016-01-25 NOTE — Assessment & Plan Note (Signed)
We'll increase her Effexor to the maximum of 300 mg daily. I'll hold this will decrease the use of additional sleep aids with her temazepam. She does sparingly uses alprazolam the bottle she has today's from February and she still has about half of the tablets.

## 2016-01-25 NOTE — Assessment & Plan Note (Signed)
Continue Tylenol 3 as needed

## 2016-01-25 NOTE — Assessment & Plan Note (Signed)
Blood pressure is well controlled 

## 2016-01-25 NOTE — Progress Notes (Signed)
Patient ID: Tiffany LamasSarah C Flowers, female   DOB: 1956/07/01, 60 y.o.   MRN: [id number]   Subjective:    Patient ID: Tiffany LamasSarah C Flowers, female    DOB: 1956/07/01, 60 y.o.   MRN: [id number]  Patient presents for CPE Is here for complete physical exam. Colonoscopy up-to-date mammogram up-to-date.  Pap smear up-to-date this was done in 2015.  Her medications were reviewed there've been no changes. She is too for fasting labs today.   In the past she was followed by psychiatrist Dr. Yolanda MangesFuller I've taken of her medications that she's been fairly stable and due to the cost. For the past couple weeks however she's had some increased anxiety to a lot of stressful things going on at work she has multiple duties and has been working late. She was on what sounds like 225 mg of Effexor and she went up to 300 mg on her own about a week ago and has noticed improvement. She will like to continue at this dose and see how she does. She still uses alprazolam as needed she has severe anxiety with driving and other issues. She also continues to take her Tylenol 3 for her chronic abdominal pain IBS symptoms. She is on temazepam at bedtime still occasionally uses an over-the-counter sleep aid to help her rest. She states she just cannot unwind herself.   Review Of Systems:  GEN- denies fatigue, fever, weight loss,weakness, recent illness HEENT- denies eye drainage, change in vision, nasal discharge, CVS- denies chest pain, palpitations RESP- denies SOB, cough, wheeze ABD- denies N/V, change in stools, abd pain GU- denies dysuria, hematuria, dribbling, incontinence MSK- denies joint pain, muscle aches, injury Neuro- denies headache, dizziness, syncope, seizure activity       Objective:    BP 124/60 mmHg  Pulse 62  Temp(Src) 98.9 F (37.2 C) (Oral)  Resp 14  Ht 5\' 1"  (1.549 m)  Wt 105 lb (47.628 kg)  BMI 19.85 kg/m2 GEN- NAD, alert and oriented x3 HEENT- PERRL, EOMI, non injected sclera, pink conjunctiva, MMM,  oropharynx clear Neck- Supple, no thyromegaly CVS- RRR, no murmur RESP-CTAB ABD-NABS,soft,NT,ND Psych- normal affect and mood  EXT- No edema Pulses- Radial, DP- 2+        Assessment & Plan:      Problem List Items Addressed This Visit    Tobacco use disorder   Routine general medical examination at a health care facility    CPE done, prevention UTD, his cause hepatitis C screening. Await looks okay she is used to being between 9000 pounds. She still has problems with her over bowels and her extreme anxiety which causes not in her stomach. She states the Tylenol 3 as needed.      Relevant Orders   CBC with Differential/Platelet   Comprehensive metabolic panel   Vitamin D, 25-hydroxy   GAD (generalized anxiety disorder)    We'll increase her Effexor to the maximum of 300 mg daily. I'll hold this will decrease the use of additional sleep aids with her temazepam. She does sparingly uses alprazolam the bottle she has today's from February and she still has about half of the tablets.      Essential hypertension, benign - Primary    Blood pressure is well controlled      Relevant Medications   amLODipine (NORVASC) 5 MG tablet   Chronic pain syndrome    Continue Tylenol 3 as needed       Other Visit Diagnoses    Need for hepatitis C screening  test        Relevant Orders    Hepatitis C Antibody       Note: This dictation was prepared with Dragon dictation along with smaller phrase technology. Any transcriptional errors that result from this process are unintentional.

## 2016-01-25 NOTE — Assessment & Plan Note (Signed)
CPE done, prevention UTD, his cause hepatitis C screening. Await looks okay she is used to being between 9000 pounds. She still has problems with her over bowels and her extreme anxiety which causes not in her stomach. She states the Tylenol 3 as needed.

## 2016-01-25 NOTE — Patient Instructions (Signed)
F/U 6 months  I recommend eye visit once a year I recommend dental visit every 6 months Goal is to  Exercise 30 minutes 5 days a week We will send a letter with lab results    

## 2016-01-31 ENCOUNTER — Ambulatory Visit
Admission: RE | Admit: 2016-01-31 | Discharge: 2016-01-31 | Disposition: A | Discharge status: Home / Self Care | Payer: 59 | Source: Ambulatory Visit

## 2016-01-31 DIAGNOSIS — Z1231 Encounter for screening mammogram for malignant neoplasm of breast: Secondary | ICD-10-CM

## 2016-03-17 ENCOUNTER — Other Ambulatory Visit: Payer: Self-pay | Admitting: Family Medicine

## 2016-03-21 NOTE — Telephone Encounter (Signed)
okay

## 2016-03-21 NOTE — Telephone Encounter (Signed)
rx called in

## 2016-03-21 NOTE — Telephone Encounter (Signed)
LRF 11/02/15 #30 + 3  LOV 01/25/16  OK refill?

## 2016-04-01 ENCOUNTER — Other Ambulatory Visit: Payer: Self-pay | Admitting: Family Medicine

## 2016-04-03 NOTE — Telephone Encounter (Signed)
Refill appropriate and filled per protocol. 

## 2016-04-14 ENCOUNTER — Telehealth: Payer: Self-pay | Admitting: *Deleted

## 2016-04-14 NOTE — Telephone Encounter (Signed)
Okay to refill? 

## 2016-04-14 NOTE — Telephone Encounter (Signed)
Received fax requesting refill on Xanax.   Ok to refill??  Last office visit/ refill 01/25/2016.

## 2016-04-17 MED ORDER — ALPRAZOLAM 0.5 MG PO TABS: 0.5000 mg | ORAL_TABLET | Freq: Every evening | ORAL | Status: DC | PRN

## 2016-04-17 NOTE — Telephone Encounter (Signed)
Medication called to pharmacy. 

## 2016-04-26 ENCOUNTER — Telehealth: Payer: Self-pay | Admitting: Family Medicine

## 2016-04-26 NOTE — Telephone Encounter (Signed)
Pt wanted to advise Dr. Jeanice Lim that she has been taking her xanax every morning for her drive to work and every evening for her drive home due to panic attacks while driving. She may need to request a refill earlier than expected.

## 2016-04-26 NOTE — Telephone Encounter (Signed)
FYI

## 2016-04-26 NOTE — Telephone Encounter (Signed)
noted 

## 2016-05-03 ENCOUNTER — Other Ambulatory Visit: Payer: Self-pay | Admitting: *Deleted

## 2016-05-03 NOTE — Telephone Encounter (Signed)
Received fax requesting refill on Xanax.   Prescription was called to pharmacy on 04/17/2016.  Call placed to pharmacy to inquire.   Was advised that partial fill was sold on 04/18/2016. (#30 tabs) Patient will be able to fill the remaining portion on 05/18/2016.

## 2016-05-12 ENCOUNTER — Ambulatory Visit (INDEPENDENT_AMBULATORY_CARE_PROVIDER_SITE_OTHER): Payer: 59 | Admitting: Family Medicine

## 2016-05-12 VITALS — BP 142/96 | HR 78 | Temp 98.6°F | Resp 16 | Ht 61.0 in | Wt 113.0 lb

## 2016-05-12 DIAGNOSIS — F411 Generalized anxiety disorder: Secondary | ICD-10-CM | POA: Diagnosis not present

## 2016-05-12 DIAGNOSIS — G894 Chronic pain syndrome: Secondary | ICD-10-CM

## 2016-05-12 MED ORDER — ALPRAZOLAM 0.5 MG PO TABS: 0.5000 mg | ORAL_TABLET | Freq: Two times a day (BID) | ORAL | 2 refills | Status: DC | PRN

## 2016-05-12 MED ORDER — ACETAMINOPHEN-CODEINE #3 300-30 MG PO TABS: 1.0000 | ORAL_TABLET | Freq: Four times a day (QID) | ORAL | 2 refills | Status: DC | PRN

## 2016-05-12 NOTE — Patient Instructions (Addendum)
F/U as previous 

## 2016-05-12 NOTE — Progress Notes (Signed)
   Subjective:    Patient ID: Tiffany LamasSarah C Frisk, female    DOB: November 26, 1955, 60 y.o.   MRN: [id number]  Patient presents for Medication Problem (Pt needs refill on her xanax - states she was having trouble getting it filled and as told she needed to come in. pt takes one tab twice a day )   Pt treated for anxiety and panic attacks while driving, she has been using Xanax BID, which I was aware of but script states daily and she could not get changed.  Still on her wellbutrin as prescribed, no concerns.  Appetite is at baseline.   No other concerns today    Review Of Systems:  GEN- denies fatigue, fever, weight loss,weakness, recent illness HEENT- denies eye drainage, change in vision, nasal discharge, CVS- denies chest pain, palpitations RESP- denies SOB, cough, wheeze ABD- denies N/V, change in stools, abd pain GU- denies dysuria, hematuria, dribbling, incontinence MSK- denies joint pain, muscle aches, injury Neuro- denies headache, dizziness, syncope, seizure activity       Objective:    BP (!) 142/96   Pulse 78   Temp 98.6 F (37 C) (Oral)   Resp 16   Ht 5\' 1"  (1.549 m)   Wt 113 lb (51.3 kg)   SpO2 98%   BMI 21.35 kg/m  GEN- NAD, alert and oriented x3 Psych- normal affect and mood         Assessment & Plan:      Problem List Items Addressed This Visit    GAD (generalized anxiety disorder) - Primary    Change xanax to BID dosing on script  Continue wellbutrin Also refilled her chronic pain medication  No signs of abuse of either medication      Chronic pain syndrome    Other Visit Diagnoses   None.     Note: This dictation was prepared with Dragon dictation along with smaller phrase technology. Any transcriptional errors that result from this process are unintentional.

## 2016-05-14 NOTE — Assessment & Plan Note (Signed)
Change xanax to BID dosing on script  Continue wellbutrin Also refilled her chronic pain medication  No signs of abuse of either medication

## 2016-05-15 ENCOUNTER — Encounter: Payer: Self-pay | Admitting: Family Medicine

## 2016-05-25 ENCOUNTER — Other Ambulatory Visit: Payer: Self-pay | Admitting: Family Medicine

## 2016-05-25 NOTE — Telephone Encounter (Signed)
Refill appropriate and filled per protocol. 

## 2016-07-07 ENCOUNTER — Other Ambulatory Visit: Payer: Self-pay | Admitting: Family Medicine

## 2016-07-13 ENCOUNTER — Other Ambulatory Visit: Payer: Self-pay | Admitting: Family Medicine

## 2016-07-13 NOTE — Telephone Encounter (Signed)
Ok to refill??  Last office visit 05/12/2016.  Last refill 03/21/2016, #2 refills.

## 2016-07-14 NOTE — Telephone Encounter (Signed)
Medication called to pharmacy. 

## 2016-07-14 NOTE — Telephone Encounter (Signed)
okay

## 2016-07-27 ENCOUNTER — Ambulatory Visit: Payer: 59

## 2016-07-27 ENCOUNTER — Ambulatory Visit (INDEPENDENT_AMBULATORY_CARE_PROVIDER_SITE_OTHER): Payer: 59

## 2016-07-27 ENCOUNTER — Ambulatory Visit (INDEPENDENT_AMBULATORY_CARE_PROVIDER_SITE_OTHER): Payer: 59 | Admitting: Podiatry

## 2016-07-27 ENCOUNTER — Encounter: Payer: Self-pay | Admitting: Podiatry

## 2016-07-27 VITALS — BP 142/84 | HR 87 | Resp 16 | Ht 60.0 in | Wt 108.0 lb

## 2016-07-27 DIAGNOSIS — M2042 Other hammer toe(s) (acquired), left foot: Secondary | ICD-10-CM

## 2016-07-27 DIAGNOSIS — M79671 Pain in right foot: Secondary | ICD-10-CM

## 2016-07-27 DIAGNOSIS — M79672 Pain in left foot: Secondary | ICD-10-CM

## 2016-07-27 DIAGNOSIS — B351 Tinea unguium: Secondary | ICD-10-CM

## 2016-07-27 DIAGNOSIS — B079 Viral wart, unspecified: Secondary | ICD-10-CM

## 2016-07-27 NOTE — Progress Notes (Signed)
   Subjective:    Patient ID: Tiffany LamasSarah C Shehan, female    DOB: 06-06-1956, 60 y.o.   MRN: [id number]  HPI Chief Complaint  Patient presents with  . Toe Pain    Left foot; pt stated, "thinks toes are curving into each other; has had hammertoe and bunion surgery 3 1/2 & 5 1/2 yrs ago"  . Painful lesion    Left foot; plantar forefoot-below 5th toe      Review of Systems  All other systems reviewed and are negative.      Objective:   Physical Exam        Assessment & Plan:

## 2016-08-04 ENCOUNTER — Ambulatory Visit (INDEPENDENT_AMBULATORY_CARE_PROVIDER_SITE_OTHER): Payer: 59 | Admitting: Family Medicine

## 2016-08-04 ENCOUNTER — Encounter: Payer: Self-pay | Admitting: Family Medicine

## 2016-08-04 VITALS — BP 126/74 | HR 90 | Temp 98.4°F | Resp 14 | Ht 60.0 in | Wt 114.0 lb

## 2016-08-04 DIAGNOSIS — I10 Essential (primary) hypertension: Secondary | ICD-10-CM

## 2016-08-04 DIAGNOSIS — F411 Generalized anxiety disorder: Secondary | ICD-10-CM

## 2016-08-04 DIAGNOSIS — Z23 Encounter for immunization: Secondary | ICD-10-CM

## 2016-08-04 DIAGNOSIS — M5126 Other intervertebral disc displacement, lumbar region: Secondary | ICD-10-CM

## 2016-08-04 DIAGNOSIS — G894 Chronic pain syndrome: Secondary | ICD-10-CM

## 2016-08-04 DIAGNOSIS — M5136 Other intervertebral disc degeneration, lumbar region: Secondary | ICD-10-CM | POA: Insufficient documentation

## 2016-08-04 DIAGNOSIS — F410 Panic disorder [episodic paroxysmal anxiety] without agoraphobia: Secondary | ICD-10-CM

## 2016-08-04 LAB — CBC WITH DIFFERENTIAL/PLATELET
BASOS ABS: 54 {cells}/uL (ref 0–200)
Basophils Relative: 1 %
EOS ABS: 108 {cells}/uL (ref 15–500)
Eosinophils Relative: 2 %
HEMATOCRIT: 37.4 % (ref 35.0–45.0)
HEMOGLOBIN: 12.5 g/dL (ref 12.0–15.0)
LYMPHS ABS: 1728 {cells}/uL (ref 850–3900)
Lymphocytes Relative: 32 %
MCH: 33.9 pg — AB (ref 27.0–33.0)
MCHC: 33.4 g/dL (ref 32.0–36.0)
MCV: 101.4 fL — AB (ref 80.0–100.0)
MONO ABS: 486 {cells}/uL (ref 200–950)
MPV: 10 fL (ref 7.5–12.5)
Monocytes Relative: 9 %
NEUTROS ABS: 3024 {cells}/uL (ref 1500–7800)
Neutrophils Relative %: 56 %
Platelets: 322 10*3/uL (ref 140–400)
RBC: 3.69 MIL/uL — ABNORMAL LOW (ref 3.80–5.10)
RDW: 12.8 % (ref 11.0–15.0)
WBC: 5.4 10*3/uL (ref 3.8–10.8)

## 2016-08-04 MED ORDER — ALPRAZOLAM 0.5 MG PO TABS: 0.5000 mg | ORAL_TABLET | Freq: Two times a day (BID) | ORAL | 2 refills | Status: DC | PRN

## 2016-08-04 MED ORDER — ACETAMINOPHEN-CODEINE #3 300-30 MG PO TABS: 1.0000 | ORAL_TABLET | Freq: Four times a day (QID) | ORAL | 2 refills | Status: DC | PRN

## 2016-08-04 NOTE — Progress Notes (Signed)
   Subjective:    Patient ID: Tiffany Flowers, female    DOB: 16-Oct-1955, 60 y.o.   MRN: [id number]  Patient presents for 6 month F/U (is not fasting)  Chronic abdominal pain- takes tylenol #3, which works, this is also tied to her anxiety, she is taking her xanax and her effexor as prescribed.  Sleep is improved with temazepam.  She has used more of her pain meds this past month due to fall, slipped off her back porch about a month ago on  wet leaves. History of bulging disc, had injections in past, improved now. Also twisted her Right Hip, did not have any bruising, occasionally feels a pulling in the post thigh   HTN- taking norvasc, no concerns   Review Of Systems:  GEN- denies fatigue, fever, weight loss,weakness, recent illness HEENT- denies eye drainage, change in vision, nasal discharge, CVS- denies chest pain, palpitations RESP- denies SOB, cough, wheeze ABD- denies N/V, change in stools, abd pain GU- denies dysuria, hematuria, dribbling, incontinence MSK- denies joint pain, muscle aches, injury Neuro- denies headache, dizziness, syncope, seizure activity       Objective:    BP 126/74 (BP Location: Left Arm, Patient Position: Sitting, Cuff Size: Normal)   Pulse 90   Temp 98.4 F (36.9 C) (Oral)   Resp 14   Ht 5' (1.524 m)   Wt 114 lb (51.7 kg)   SpO2 99%   BMI 22.26 kg/m  GEN- NAD, alert and oriented x3 HEENT- PERRL, EOMI, non injected sclera, pink conjunctiva, MMM, oropharynx clear CVS- RRR, no murmur RESP-CTAB ABD-NABS,soft,NT,ND Psych- normal affect and mood MSK- Spine NT, fair ROM, fair ROM hips, knees, neg SLR  EXT- No edema Pulses- Radial 2+        Assessment & Plan:      Problem List Items Addressed This Visit    None    Visit Diagnoses   None.     Note: This dictation was prepared with Dragon dictation along with smaller phrase technology. Any transcriptional errors that result from this process are unintentional.

## 2016-08-04 NOTE — Patient Instructions (Signed)
F/U end of April for Physical  Flu shot given  We will call with lab results

## 2016-08-05 ENCOUNTER — Encounter: Payer: Self-pay | Admitting: Family Medicine

## 2016-08-05 LAB — COMPREHENSIVE METABOLIC PANEL
ALBUMIN: 4.5 g/dL (ref 3.6–5.1)
ALT: 14 U/L (ref 6–29)
AST: 21 U/L (ref 10–35)
Alkaline Phosphatase: 77 U/L (ref 33–130)
BUN: 9 mg/dL (ref 7–25)
CALCIUM: 9.8 mg/dL (ref 8.6–10.4)
CHLORIDE: 99 mmol/L (ref 98–110)
CO2: 24 mmol/L (ref 20–31)
CREATININE: 0.64 mg/dL (ref 0.50–0.99)
Glucose, Bld: 78 mg/dL (ref 70–99)
POTASSIUM: 4.5 mmol/L (ref 3.5–5.3)
SODIUM: 135 mmol/L (ref 135–146)
TOTAL PROTEIN: 7 g/dL (ref 6.1–8.1)
Total Bilirubin: 0.3 mg/dL (ref 0.2–1.2)

## 2016-08-05 LAB — LIPID PANEL
CHOLESTEROL: 190 mg/dL (ref 125–200)
HDL: 90 mg/dL (ref >=46)
LDL Cholesterol: 57 mg/dL (ref <130)
TRIGLYCERIDES: 213 mg/dL — AB (ref <150)
Total CHOL/HDL Ratio: 2.1 Ratio (ref <=5.0)
VLDL: 43 mg/dL — AB (ref <30)

## 2016-08-05 NOTE — Assessment & Plan Note (Signed)
Doing well on effexor and xanax,  restoril for sleep Continue regimen  Refilled pain meds as well

## 2016-08-05 NOTE — Assessment & Plan Note (Signed)
Monitor for now, did not overuse her pain medication , no early refills noted

## 2016-08-05 NOTE — Assessment & Plan Note (Signed)
Well controlled, no changes 

## 2016-08-06 NOTE — Progress Notes (Signed)
Subjective:     Patient ID: Tiffany Flowers, female   DOB: 06-28-1956, 60 y.o.   MRN: [id number]  HPI patient presents stating that she has lesions on the bottom of both feet digital deformities and some yellow numbness of the nail but she's concerned about. States that it's been gradually getting more aggravating over time   Review of Systems  All other systems reviewed and are negative.      Objective:   Physical Exam  Constitutional: She is oriented to person, place, and time.  Cardiovascular: Intact distal pulses.   Musculoskeletal: Normal range of motion.  Neurological: She is oriented to person, place, and time.  Skin: Skin is warm.  Nursing note and vitals reviewed.  neurovascular status intact muscle strength adequate range of motion within normal limits with patient noted to have lesions plantar that upon debridement show pinpoint bleeding pain to lateral pressure. Patient's found to have yellow hallux nail and is noted to have deformity of both feet within the lesser digits that is more distal around the fourth and fifth toes     Assessment:     Numerous problems including verruca and probable digital hammertoe deformity along with mycotic nail disease    Plan:     H&P x-rays reviewed all conditions discussed. Today debridement accomplished with no iatrogenic bleeding and I then applied immune agent to create response was sterile dressings. I advised on formula 3 for nails and patient be seen back 5 weeks and was given instructions on what to do with blistering were to occur  X-ray report indicated the digits and good alignment with no signs of severe structural changes

## 2016-08-09 ENCOUNTER — Ambulatory Visit: Payer: 59 | Admitting: Podiatry

## 2016-08-17 ENCOUNTER — Encounter: Payer: Self-pay | Admitting: *Deleted

## 2016-08-20 ENCOUNTER — Other Ambulatory Visit: Payer: Self-pay | Admitting: Family Medicine

## 2016-08-21 NOTE — Telephone Encounter (Signed)
This can not be filled again until 12/3

## 2016-08-21 NOTE — Telephone Encounter (Signed)
Ok to refill??  Last office visit/ refill 08/04/2016.

## 2016-08-31 ENCOUNTER — Ambulatory Visit (INDEPENDENT_AMBULATORY_CARE_PROVIDER_SITE_OTHER): Payer: 59 | Admitting: Podiatry

## 2016-08-31 ENCOUNTER — Encounter: Payer: Self-pay | Admitting: Podiatry

## 2016-08-31 DIAGNOSIS — M2042 Other hammer toe(s) (acquired), left foot: Secondary | ICD-10-CM

## 2016-08-31 DIAGNOSIS — B079 Viral wart, unspecified: Secondary | ICD-10-CM

## 2016-08-31 NOTE — Progress Notes (Signed)
Subjective:     Patient ID: Tiffany LamasSarah C Flowers, female   DOB: [birth year], 60 y.o.   MRN: [id number]  HPI patient states she had a good reaction last time with blistering around the warts and she feels like they're getting smaller and she does have a rotation of the fifth toe left that is causing the distal lateral lesion that's very painful when pressed   Review of Systems     Objective:   Physical Exam Neurovascular status intact with approximate 4 separate lesions plantar aspect left lateral foot that are painful and she'll bleeding upon pinpoint debridement. 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

## 2016-09-28 ENCOUNTER — Telehealth: Payer: Self-pay | Admitting: *Deleted

## 2016-09-28 NOTE — Telephone Encounter (Signed)
Pt states she would like to cancel her 10/13/2016 appt and discuss setting up surgery. Left message informing pt that she would need an appt to discuss surgery and sign consent form, and if she needed to reschedule the 10/13/2016 appt it would be best to speak with the schedulers so she could set up the appt to discuss surgery.

## 2016-10-13 ENCOUNTER — Ambulatory Visit: Payer: 59 | Admitting: Podiatry

## 2016-10-25 ENCOUNTER — Other Ambulatory Visit: Payer: Self-pay | Admitting: Family Medicine

## 2016-11-02 ENCOUNTER — Telehealth: Payer: Self-pay | Admitting: *Deleted

## 2016-11-02 NOTE — Telephone Encounter (Signed)
Received fax requesting refill on Temazepam.   Ok to refill??  Last office visit 08/04/2016  Last refill 07/14/2016, #2 refills

## 2016-11-03 MED ORDER — TEMAZEPAM 30 MG PO CAPS: 30.0000 mg | ORAL_CAPSULE | Freq: Every evening | ORAL | 2 refills | Status: DC | PRN

## 2016-11-03 NOTE — Telephone Encounter (Signed)
Okay to refill? 

## 2016-11-03 NOTE — Telephone Encounter (Signed)
Medication called to pharmacy. 

## 2016-11-07 DIAGNOSIS — R6889 Other general symptoms and signs: Secondary | ICD-10-CM | POA: Diagnosis not present

## 2016-11-24 ENCOUNTER — Other Ambulatory Visit: Payer: Self-pay | Admitting: Family Medicine

## 2016-11-24 NOTE — Telephone Encounter (Signed)
Refill appropriate 

## 2016-11-30 ENCOUNTER — Other Ambulatory Visit: Payer: Self-pay | Admitting: *Deleted

## 2016-11-30 NOTE — Telephone Encounter (Signed)
Received fax requesting refill on xanax.   Ok to refill??  Last office visit/ refill 08/04/2016, #2 refills.

## 2016-12-01 MED ORDER — ALPRAZOLAM 0.5 MG PO TABS: 0.5000 mg | ORAL_TABLET | Freq: Two times a day (BID) | ORAL | 2 refills | Status: DC | PRN

## 2016-12-01 NOTE — Telephone Encounter (Signed)
Medication called to pharmacy. 

## 2016-12-01 NOTE — Telephone Encounter (Signed)
Okay 

## 2016-12-08 ENCOUNTER — Ambulatory Visit: Payer: 59 | Admitting: Podiatry

## 2016-12-18 ENCOUNTER — Other Ambulatory Visit: Payer: Self-pay | Admitting: Family Medicine

## 2016-12-18 NOTE — Telephone Encounter (Signed)
Okay to refill? 

## 2016-12-18 NOTE — Telephone Encounter (Signed)
Medication called to pharmacy. 

## 2016-12-18 NOTE — Telephone Encounter (Signed)
Ok to refill??  Last office visit/ refill 08/04/2016, #1 refill.

## 2017-01-10 ENCOUNTER — Other Ambulatory Visit: Payer: Self-pay | Admitting: Family Medicine

## 2017-01-10 DIAGNOSIS — Z1231 Encounter for screening mammogram for malignant neoplasm of breast: Secondary | ICD-10-CM

## 2017-02-05 ENCOUNTER — Ambulatory Visit: Payer: 59

## 2017-02-09 ENCOUNTER — Encounter: Payer: 59 | Admitting: Family Medicine

## 2017-02-14 ENCOUNTER — Other Ambulatory Visit: Payer: Self-pay | Admitting: Family Medicine

## 2017-02-15 NOTE — Telephone Encounter (Signed)
Ok to refill??  Last office visit 08/04/2016.  Last refill 12/18/2016.

## 2017-02-15 NOTE — Telephone Encounter (Signed)
Medication called to pharmacy. 

## 2017-02-15 NOTE — Telephone Encounter (Signed)
okay

## 2017-02-19 ENCOUNTER — Ambulatory Visit
Admission: RE | Admit: 2017-02-19 | Discharge: 2017-02-19 | Disposition: A | Discharge status: Home / Self Care | Payer: 59 | Source: Ambulatory Visit | Attending: Family Medicine | Admitting: Family Medicine

## 2017-02-19 DIAGNOSIS — Z1231 Encounter for screening mammogram for malignant neoplasm of breast: Secondary | ICD-10-CM

## 2017-02-26 ENCOUNTER — Other Ambulatory Visit: Payer: Self-pay | Admitting: Family Medicine

## 2017-02-27 NOTE — Telephone Encounter (Signed)
Ok to refill 

## 2017-02-27 NOTE — Telephone Encounter (Signed)
Okay give 3  

## 2017-03-06 ENCOUNTER — Other Ambulatory Visit: Payer: Self-pay

## 2017-03-06 MED ORDER — ALPRAZOLAM 0.5 MG PO TABS: 0.5000 mg | ORAL_TABLET | Freq: Two times a day (BID) | ORAL | 2 refills | Status: DC | PRN

## 2017-03-06 NOTE — Telephone Encounter (Signed)
Rx called into the pharmacy. 

## 2017-03-06 NOTE — Telephone Encounter (Signed)
okay

## 2017-03-06 NOTE — Telephone Encounter (Signed)
Last OV 11/03 Last refill 3/2  Ok to refill?

## 2017-03-16 ENCOUNTER — Encounter: Payer: Self-pay | Admitting: Family Medicine

## 2017-03-16 ENCOUNTER — Ambulatory Visit (INDEPENDENT_AMBULATORY_CARE_PROVIDER_SITE_OTHER): Payer: 59 | Admitting: Family Medicine

## 2017-03-16 VITALS — BP 136/72 | HR 80 | Temp 98.8°F | Resp 14 | Ht 60.0 in | Wt 114.0 lb

## 2017-03-16 DIAGNOSIS — Z0000 Encounter for general adult medical examination without abnormal findings: Secondary | ICD-10-CM

## 2017-03-16 DIAGNOSIS — Z124 Encounter for screening for malignant neoplasm of cervix: Secondary | ICD-10-CM | POA: Diagnosis not present

## 2017-03-16 DIAGNOSIS — G894 Chronic pain syndrome: Secondary | ICD-10-CM

## 2017-03-16 DIAGNOSIS — F410 Panic disorder [episodic paroxysmal anxiety] without agoraphobia: Secondary | ICD-10-CM

## 2017-03-16 DIAGNOSIS — I10 Essential (primary) hypertension: Secondary | ICD-10-CM

## 2017-03-16 DIAGNOSIS — F17200 Nicotine dependence, unspecified, uncomplicated: Secondary | ICD-10-CM

## 2017-03-16 DIAGNOSIS — F411 Generalized anxiety disorder: Secondary | ICD-10-CM

## 2017-03-16 DIAGNOSIS — E781 Pure hyperglyceridemia: Secondary | ICD-10-CM

## 2017-03-16 DIAGNOSIS — M79674 Pain in right toe(s): Secondary | ICD-10-CM

## 2017-03-16 DIAGNOSIS — Z23 Encounter for immunization: Secondary | ICD-10-CM | POA: Diagnosis not present

## 2017-03-16 LAB — COMPREHENSIVE METABOLIC PANEL
ALT: 14 U/L (ref 6–29)
AST: 16 U/L (ref 10–35)
Albumin: 4.7 g/dL (ref 3.6–5.1)
Alkaline Phosphatase: 74 U/L (ref 33–130)
BILIRUBIN TOTAL: 0.3 mg/dL (ref 0.2–1.2)
BUN: 9 mg/dL (ref 7–25)
CALCIUM: 9.8 mg/dL (ref 8.6–10.4)
CHLORIDE: 98 mmol/L (ref 98–110)
CO2: 26 mmol/L (ref 20–31)
Creat: 0.71 mg/dL (ref 0.50–0.99)
GLUCOSE: 82 mg/dL (ref 70–99)
POTASSIUM: 4.2 mmol/L (ref 3.5–5.3)
Sodium: 135 mmol/L (ref 135–146)
Total Protein: 6.9 g/dL (ref 6.1–8.1)

## 2017-03-16 LAB — CBC WITH DIFFERENTIAL/PLATELET
BASOS ABS: 68 {cells}/uL (ref 0–200)
Basophils Relative: 1 %
EOS ABS: 136 {cells}/uL (ref 15–500)
Eosinophils Relative: 2 %
HEMATOCRIT: 38.5 % (ref 35.0–45.0)
HEMOGLOBIN: 12.5 g/dL (ref 12.0–15.0)
LYMPHS ABS: 1904 {cells}/uL (ref 850–3900)
LYMPHS PCT: 28 %
MCH: 32.7 pg (ref 27.0–33.0)
MCHC: 32.5 g/dL (ref 32.0–36.0)
MCV: 100.8 fL — AB (ref 80.0–100.0)
MONO ABS: 476 {cells}/uL (ref 200–950)
MPV: 8.8 fL (ref 7.5–12.5)
Monocytes Relative: 7 %
NEUTROS PCT: 62 %
Neutro Abs: 4216 cells/uL (ref 1500–7800)
Platelets: 301 10*3/uL (ref 140–400)
RBC: 3.82 MIL/uL (ref 3.80–5.10)
RDW: 13 % (ref 11.0–15.0)
WBC: 6.8 10*3/uL (ref 3.8–10.8)

## 2017-03-16 LAB — LIPID PANEL
CHOL/HDL RATIO: 2.2 ratio (ref <5.0)
Cholesterol: 212 mg/dL — ABNORMAL HIGH (ref <200)
HDL: 95 mg/dL (ref >50)
LDL CALC: 88 mg/dL (ref <100)
TRIGLYCERIDES: 144 mg/dL (ref <150)
VLDL: 29 mg/dL (ref <30)

## 2017-03-16 MED ORDER — ACETAMINOPHEN-CODEINE #3 300-30 MG PO TABS: 1.0000 | ORAL_TABLET | Freq: Four times a day (QID) | ORAL | 0 refills | Status: DC | PRN

## 2017-03-16 MED ORDER — TEMAZEPAM 30 MG PO CAPS: ORAL_CAPSULE | ORAL | 3 refills | Status: DC

## 2017-03-16 NOTE — Assessment & Plan Note (Signed)
Well-controlled no change in medication 

## 2017-03-16 NOTE — Assessment & Plan Note (Signed)
She is stable on her current regimen. She is no longer following with psychiatry at this time.

## 2017-03-16 NOTE — Assessment & Plan Note (Addendum)
Physical exam done. Pap smear done. She is given shingles vaccine. We discussed bone density but she is worried about the cost to her insurance therefore will check into it before we schedule. For the bony lesion on her toe will obtain an x-ray to see if her hardware is intact

## 2017-03-16 NOTE — Assessment & Plan Note (Signed)
Counseled on tobacco cessation she does not want to quit.

## 2017-03-16 NOTE — Patient Instructions (Addendum)
Shingles vaccine Check on Bone Density  [phone]- for the Xray of your foot  We will call with lab results Schedule and eye visit F/U 6 months

## 2017-03-16 NOTE — Assessment & Plan Note (Signed)
Continue with current pain medication regimen.

## 2017-03-16 NOTE — Progress Notes (Signed)
Subjective:    Patient ID: Tiffany Flowers, female    DOB: Nov 05, 1955, 61 y.o.   MRN: [id number]  Patient presents for CPE with PAP (is fasting)  Pt here for CPE and PAP smear  Last PAP March 2018 Mammogram- UTD  May normal  COlonoscopy- UTD  Immunizations- Due for shingles vaccine   Wears readers , but at end of day feels vision gets blurry after using computer all day   Concerned about a bony Protrusion on her right foot second digit she is history of surgery with rod placement in that toe. The past couple months has noticed some pain in this lesion not sure if something is coming loose. She cannot afford to get back to the foot doctor at this time.  Chronic abdominal pain she continues to take her Tylenol with codeine as needed.  Generalized anxiety and insomnia she continues with her current dose of Effexor Review Of Systems:  GEN- denies fatigue, fever, weight loss,weakness, recent illness HEENT- denies eye drainage, change in vision, nasal discharge, CVS- denies chest pain, palpitations RESP- denies SOB, cough, wheeze ABD- denies N/V, change in stools, abd pain GU- denies dysuria, hematuria, dribbling, incontinence MSK- denies joint pain, muscle aches, injury Neuro- denies headache, dizziness, syncope, seizure activity       Objective:    BP 136/72   Pulse 80   Temp 98.8 F (37.1 C) (Oral)   Resp 14   Ht 5' (1.524 m)   Wt 114 lb (51.7 kg)   SpO2 98%   BMI 22.26 kg/m  GEN- NAD, alert and oriented x3 HEENT- PERRL, EOMI, non injected sclera, pink conjunctiva, MMM, oropharynx clear Neck- Supple, no thyromegaly Breast- normal symmetry, no nipple inversion,no nipple drainage, no nodules or lumps felt Nodes- no axillary nodes CVS- RRR, no murmur RESP-CTAB ABD-NABS,soft,NT,ND GU- normal external genitalia, vaginal atrophy noted, cervix visualized no growth - stenosis at os, no blood form os, No discharge, no CMT, no ovarian masses, uterus normal size Rectum- normal  tone, FOBT neg Psych- normal affect and mood  EXT- No edema right foot- old incision ovefr 2nd digit, mild tender bony protusion adjancent to incision, decreased ROM of toe Pulses- Radial, DP- 2+        Assessment & Plan:      Problem List Items Addressed This Visit    GAD (generalized anxiety disorder)   Tobacco use disorder    Counseled on tobacco cessation she does not want to quit.      Routine general medical examination at a health care facility - Primary    Physical exam done. Pap smear done. She is given shingles vaccine. We discussed bone density but she is worried about the cost to her insurance therefore will check into it before we schedule. For the bony lesion on her toe will obtain an x-ray to see if her hardware is intact      Relevant Orders   CBC with Differential/Platelet   Comprehensive metabolic panel   Panic disorder    She is stable on her current regimen. She is no longer following with psychiatry at this time.      High triglycerides    Check cholesterol      Relevant Orders   Lipid panel   Essential hypertension, benign    Well-controlled no change in medication.      Relevant Orders   CBC with Differential/Platelet   Comprehensive metabolic panel   Chronic pain syndrome    Continue with current  pain medication regimen.       Other Visit Diagnoses    Toe pain, right       Relevant Orders   DG Foot Complete Right   Cervical cancer screening       Relevant Orders   PAP, ThinPrep ASCUS Rflx HPV Rflx Type   Need for vaccination for zoster       Relevant Orders   Varicella-zoster vaccine IM (Shingrix) (Completed)      Note: This dictation was prepared with Dragon dictation along with smaller phrase technology. Any transcriptional errors that result from this process are unintentional.

## 2017-03-16 NOTE — Assessment & Plan Note (Signed)
Check cholesterol.

## 2017-03-21 LAB — PAP THINPREP ASCUS RFLX HPV RFLX TYPE

## 2017-03-22 ENCOUNTER — Encounter: Payer: Self-pay | Admitting: *Deleted

## 2017-03-26 ENCOUNTER — Other Ambulatory Visit: Payer: Self-pay | Admitting: Family Medicine

## 2017-04-05 ENCOUNTER — Other Ambulatory Visit: Payer: Self-pay | Admitting: Family Medicine

## 2017-04-05 DIAGNOSIS — Z135 Encounter for screening for eye and ear disorders: Secondary | ICD-10-CM | POA: Diagnosis not present

## 2017-04-13 ENCOUNTER — Ambulatory Visit (HOSPITAL_COMMUNITY)
Admission: EM | Admit: 2017-04-13 | Discharge: 2017-04-13 | Disposition: A | Discharge status: Home / Self Care | Payer: 59 | Attending: Internal Medicine | Admitting: Internal Medicine

## 2017-04-13 ENCOUNTER — Ambulatory Visit (INDEPENDENT_AMBULATORY_CARE_PROVIDER_SITE_OTHER): Payer: 59

## 2017-04-13 ENCOUNTER — Encounter (HOSPITAL_COMMUNITY): Payer: Self-pay | Admitting: Emergency Medicine

## 2017-04-13 DIAGNOSIS — S52515A Nondisplaced fracture of left radial styloid process, initial encounter for closed fracture: Secondary | ICD-10-CM

## 2017-04-13 MED ORDER — HYDROCODONE-ACETAMINOPHEN 5-325 MG PO TABS: 1.0000 | ORAL_TABLET | ORAL | 0 refills | Status: DC | PRN

## 2017-04-13 NOTE — ED Provider Notes (Signed)
CSN: [id number]     Arrival date & time 04/13/17  1326 History   First MD Initiated Contact with Patient 04/13/17 1443     Chief Complaint  Patient presents with  . Hand Injury   (Consider location/radiation/quality/duration/timing/severity/associated sxs/prior Treatment) Patient is a right-handed female that presents for evaluation of an injury to her left wrist.  Injury occurred 1 day ago and has been gradually worsening since. Injury occurred while she was at home.  Patient reports that she accidentally fell off a step ladder at home last night. She denies hitting her head or any loss of consciousness. No neck or back pain. She has not had a similar injury in the past.  She states that the pain is severe.  She has noticed paresthesias in the hand since the injury.  She has not been seen prior to today for this injury. Care prior to arrival consisted of ice, with minimal relief.          Past Medical History:  Diagnosis Date  . Back pain    Bulging discs  . Chronic pain    Chronic abdominal pain  . Hypertension   . IBS (irritable bowel syndrome)   . Panic disorder   . Vocal cord nodules    Removed   Past Surgical History:  Procedure Laterality Date  . CESAREAN SECTION     x 2  . TONSILLECTOMY     Family History  Problem Relation Age of Onset  . Dementia Mother   . Heart disease Father 867       CABG  . Cancer Father [id number]       Cancer Jaw  . Hyperlipidemia Sister   . Cancer Sister        Cancer Near Liver ?   Social History  Substance Use Topics  . Smoking status: Current Every Day Smoker    Types: Cigarettes  . Smokeless tobacco: Never Used  . Alcohol use Yes   OB History    No data available     Review of Systems  Musculoskeletal: Positive for joint swelling.  All other systems reviewed and are negative.   Allergies  Wellbutrin [bupropion]  Home Medications   Prior to Admission medications   Medication Sig Start Date End Date Taking? Authorizing  Provider  acetaminophen-codeine (TYLENOL #3) 300-30 MG tablet Take 1 tablet by mouth every 6 (six) hours as needed. for pain 03/16/17  Yes [place], Velna HatchetKawanta F, MD  ALPRAZolam Prudy Feeler(XANAX) 0.5 MG tablet Take 1 tablet (0.5 mg total) by mouth 2 (two) times daily as needed for anxiety. 03/06/17  Yes [place], Velna HatchetKawanta F, MD  amLODipine (NORVASC) 5 MG tablet TAKE 1 TABLET(5 MG) BY MOUTH DAILY 03/26/17  Yes [place], Velna HatchetKawanta F, MD  cholecalciferol (VITAMIN D) 1000 units tablet Take 1 tablet (1,000 Units total) by mouth daily. 01/25/16  Yes [place], Velna HatchetKawanta F, MD  ferrous sulfate 325 (65 FE) MG tablet Take 1 tablet (325 mg total) by mouth daily with breakfast. 01/25/16  Yes [place], Velna HatchetKawanta F, MD  temazepam (RESTORIL) 30 MG capsule TAKE 1 CAPSULE BY MOUTH EVERY DAY AT BEDTIME AS NEEDED 03/16/17  Yes [place], Velna HatchetKawanta F, MD  venlafaxine XR (EFFEXOR-XR) 150 MG 24 hr capsule TAKE 2 CAPSULES BY MOUTH EVERY DAY WITH BREAKFAST 10/25/16  Yes [place], Velna HatchetKawanta F, MD  venlafaxine XR (EFFEXOR-XR) 150 MG 24 hr capsule TAKE 2 CAPSULES BY MOUTH EVERY DAY WITH BREAKFAST 04/05/17  Yes [place], Velna HatchetKawanta F, MD  calcium carbonate (OS-CAL) 600 MG TABS  tablet Take 600 mg by mouth 2 (two) times daily with a meal.    [provider]   Meds Ordered and Administered this Visit  Medications - No data to display  BP 130/67 (BP Location: Right Arm)   Pulse 86   Temp 98.5 F (36.9 C) (Oral)   Resp 20   SpO2 100%  No data found.   Physical Exam  Constitutional: She is oriented to person, place, and time. She appears well-developed and well-nourished.  Neck: Normal range of motion.  Cardiovascular: Normal rate and regular rhythm.   Pulmonary/Chest: Effort normal and breath sounds normal.  Musculoskeletal: Normal range of motion.  Tenderness and mild edema to left wrist. Limited ROM due to pain. No obvious deformity. Neurovascular intact   Neurological: She is alert and oriented to person, place, and time.  Skin: Skin is warm and dry.   Psychiatric: She has a normal mood and affect.    Urgent Care Course     Procedures (including critical care time)  Labs Review Labs Reviewed - No data to display  Imaging Review Dg Wrist Complete Left  Result Date: 04/13/2017 CLINICAL DATA:  Larey Seat backwards in the home yesterday.  Wrist pain. EXAM: LEFT WRIST - COMPLETE 3+ VIEW COMPARISON:  None. FINDINGS: Nondisplaced fracture of the radial styloid. No other traumatic finding. Osteoarthritis of the radiocarpal joint with small cysts in the lunate. IMPRESSION: Nondisplaced fracture of the radial styloid. Electronically Signed   By: Paulina Fusi M.D.   On: 04/13/2017 14:48     Visual Acuity Review  Right Eye Distance:   Left Eye Distance:   Bilateral Distance:    Right Eye Near:   Left Eye Near:    Bilateral Near:         MDM   1. Closed nondisplaced fracture of styloid process of left radius, initial encounter    Ortho tech to apply splint in clinic  Norco as needed for pain  Follow-up with ortho   Discussed diagnosis and treatment with patient. All questions have been answered and all concerns have been addressed. The patient verbalized understanding and had no further questions     Lurline Idol, [place] 04/13/17 1523

## 2017-04-13 NOTE — ED Triage Notes (Signed)
Pt reports she inj her left hand/wrist last night  Sts she fell of a step ladder about 72ft.... Fell backwards and braced herself w/hands  Sx today include swelling and pain  Taking Tyle #3 w/temp relief.   A&O x4... NAD.Tiffany Flowers.. ambulatory

## 2017-04-13 NOTE — Progress Notes (Signed)
Orthopedic Tech Progress Note Patient Details:  Tiffany LamasSarah C Flowers Jul 16, 1956 [id number]  Ortho Devices Type of Ortho Device: Thumb spica splint, Arm sling Ortho Device/Splint Location: applied thumb spica splint top left hand/arm.  pt tolerated application very well.  provided arm sling to left arm for support.  Left wrist/arm. Ortho Device/Splint Interventions: Application, Adjustment   Alvina ChouWilliams, Exavior Kimmons C 04/13/2017, 4:49 PM

## 2017-05-01 DIAGNOSIS — S52572A Other intraarticular fracture of lower end of left radius, initial encounter for closed fracture: Secondary | ICD-10-CM | POA: Diagnosis not present

## 2017-05-01 DIAGNOSIS — M25532 Pain in left wrist: Secondary | ICD-10-CM | POA: Diagnosis not present

## 2017-05-02 ENCOUNTER — Other Ambulatory Visit: Payer: Self-pay | Admitting: Family Medicine

## 2017-05-03 ENCOUNTER — Telehealth: Payer: Self-pay | Admitting: Family Medicine

## 2017-05-03 NOTE — Telephone Encounter (Signed)
Patient would like to speak to someone regarding her pain med that she takes for her stomach  [phone]

## 2017-05-04 NOTE — Telephone Encounter (Signed)
Please call pt and triage and see what this issue is

## 2017-05-04 NOTE — Telephone Encounter (Signed)
Patient calling just to inform that she broke her arm x 3 weeks ago & it's been re-casted with possible surgery. And that she requested something for pain from the Orthopedic doctor & they suggested Tylenol. So she has been taking her Norco/Vicodan for the pain for her injury. Stated she may call in early for a refill & that's the reason why.

## 2017-05-04 NOTE — Telephone Encounter (Signed)
noted 

## 2017-05-15 DIAGNOSIS — S52572D Other intraarticular fracture of lower end of left radius, subsequent encounter for closed fracture with routine healing: Secondary | ICD-10-CM | POA: Diagnosis not present

## 2017-06-01 DIAGNOSIS — S52572D Other intraarticular fracture of lower end of left radius, subsequent encounter for closed fracture with routine healing: Secondary | ICD-10-CM | POA: Diagnosis not present

## 2017-06-05 ENCOUNTER — Other Ambulatory Visit: Payer: Self-pay | Admitting: Family Medicine

## 2017-06-25 ENCOUNTER — Other Ambulatory Visit: Payer: Self-pay | Admitting: Family Medicine

## 2017-06-25 NOTE — Telephone Encounter (Signed)
Okay to refill? 

## 2017-06-25 NOTE — Telephone Encounter (Signed)
LRF 04/13/17  #10   LOV  03/16/17  OK refill?

## 2017-06-25 NOTE — Telephone Encounter (Signed)
RX called in .

## 2017-06-26 ENCOUNTER — Other Ambulatory Visit: Payer: Self-pay | Admitting: Family Medicine

## 2017-06-27 DIAGNOSIS — L209 Atopic dermatitis, unspecified: Secondary | ICD-10-CM | POA: Diagnosis not present

## 2017-06-27 DIAGNOSIS — J019 Acute sinusitis, unspecified: Secondary | ICD-10-CM | POA: Diagnosis not present

## 2017-07-10 ENCOUNTER — Other Ambulatory Visit: Payer: Self-pay | Admitting: Family Medicine

## 2017-07-10 NOTE — Telephone Encounter (Signed)
Okay to refill? 

## 2017-07-10 NOTE — Telephone Encounter (Signed)
Pharm requesting refill on Xanax - last rf 03/06/17 #60 /2

## 2017-07-11 MED ORDER — ALPRAZOLAM 0.5 MG PO TABS: 0.5000 mg | ORAL_TABLET | Freq: Two times a day (BID) | ORAL | 2 refills | Status: DC | PRN

## 2017-07-11 NOTE — Telephone Encounter (Signed)
Medication called to pharmacy. 

## 2017-07-12 ENCOUNTER — Telehealth: Payer: Self-pay | Admitting: Family Medicine

## 2017-07-12 NOTE — Telephone Encounter (Signed)
Requesting a refill on Temazepam - Ok to refill??       

## 2017-07-15 NOTE — Telephone Encounter (Signed)
Okay to refill? 

## 2017-07-16 MED ORDER — TEMAZEPAM 30 MG PO CAPS: ORAL_CAPSULE | ORAL | 3 refills | Status: DC

## 2017-07-16 NOTE — Telephone Encounter (Signed)
Medication called/sent to requested pharmacy  

## 2017-08-01 DIAGNOSIS — J019 Acute sinusitis, unspecified: Secondary | ICD-10-CM | POA: Diagnosis not present

## 2017-08-01 DIAGNOSIS — R05 Cough: Secondary | ICD-10-CM | POA: Diagnosis not present

## 2017-08-01 DIAGNOSIS — M542 Cervicalgia: Secondary | ICD-10-CM | POA: Diagnosis not present

## 2017-08-14 ENCOUNTER — Telehealth: Payer: Self-pay | Admitting: *Deleted

## 2017-08-14 MED ORDER — ACETAMINOPHEN-CODEINE #3 300-30 MG PO TABS: 1.0000 | ORAL_TABLET | Freq: Four times a day (QID) | ORAL | 0 refills | Status: DC | PRN

## 2017-08-14 NOTE — Telephone Encounter (Signed)
Received fax requesting refill on Tylenol #3.   Ok to refill??  Last office visit 03/16/2017.  Last refill 06/25/2017.

## 2017-08-14 NOTE — Telephone Encounter (Signed)
Okay to refill? 

## 2017-08-14 NOTE — Telephone Encounter (Signed)
Medication called to pharmacy. 

## 2017-09-06 ENCOUNTER — Telehealth: Payer: Self-pay | Admitting: Family Medicine

## 2017-09-06 NOTE — Telephone Encounter (Signed)
Send to another pharmacy, she is stable on medication

## 2017-09-06 NOTE — Telephone Encounter (Signed)
[facility] [address]  Patient is calling to say that her tamazepam is on back order at her pharmacy  Please advise  [phone]

## 2017-09-06 NOTE — Telephone Encounter (Signed)
Per pharmacy, Temazepam has been on periodic back order since 04/2017. States that they will get limited supply every month.   [facility] [address] has [facility]am available at this time if you would like to send to another pharmacy.   MD please advise.

## 2017-09-07 ENCOUNTER — Other Ambulatory Visit: Payer: Self-pay | Admitting: Family Medicine

## 2017-09-07 DIAGNOSIS — S52572D Other intraarticular fracture of lower end of left radius, subsequent encounter for closed fracture with routine healing: Secondary | ICD-10-CM | POA: Diagnosis not present

## 2017-09-07 DIAGNOSIS — M25532 Pain in left wrist: Secondary | ICD-10-CM | POA: Diagnosis not present

## 2017-09-07 MED ORDER — TEMAZEPAM 30 MG PO CAPS: ORAL_CAPSULE | ORAL | 3 refills | Status: DC

## 2017-09-07 NOTE — Telephone Encounter (Signed)
Medication called to pharmacy.  Call placed to patient and patient made aware per VM.  

## 2017-09-14 ENCOUNTER — Other Ambulatory Visit: Payer: Self-pay | Admitting: Family Medicine

## 2017-09-14 ENCOUNTER — Encounter: Payer: Self-pay | Admitting: Family Medicine

## 2017-09-14 ENCOUNTER — Other Ambulatory Visit: Payer: Self-pay

## 2017-09-14 ENCOUNTER — Ambulatory Visit: Payer: 59 | Admitting: Family Medicine

## 2017-09-14 VITALS — BP 124/76 | HR 84 | Temp 97.6°F | Resp 18 | Wt 120.6 lb

## 2017-09-14 DIAGNOSIS — F411 Generalized anxiety disorder: Secondary | ICD-10-CM | POA: Diagnosis not present

## 2017-09-14 DIAGNOSIS — F5101 Primary insomnia: Secondary | ICD-10-CM | POA: Diagnosis not present

## 2017-09-14 DIAGNOSIS — K580 Irritable bowel syndrome with diarrhea: Secondary | ICD-10-CM | POA: Diagnosis not present

## 2017-09-14 DIAGNOSIS — R296 Repeated falls: Secondary | ICD-10-CM | POA: Diagnosis not present

## 2017-09-14 DIAGNOSIS — Z23 Encounter for immunization: Secondary | ICD-10-CM | POA: Diagnosis not present

## 2017-09-14 DIAGNOSIS — Z1321 Encounter for screening for nutritional disorder: Secondary | ICD-10-CM | POA: Diagnosis not present

## 2017-09-14 DIAGNOSIS — F410 Panic disorder [episodic paroxysmal anxiety] without agoraphobia: Secondary | ICD-10-CM | POA: Diagnosis not present

## 2017-09-14 DIAGNOSIS — G44209 Tension-type headache, unspecified, not intractable: Secondary | ICD-10-CM | POA: Diagnosis not present

## 2017-09-14 DIAGNOSIS — I10 Essential (primary) hypertension: Secondary | ICD-10-CM

## 2017-09-14 DIAGNOSIS — G894 Chronic pain syndrome: Secondary | ICD-10-CM

## 2017-09-14 MED ORDER — VENLAFAXINE HCL ER 150 MG PO CP24: ORAL_CAPSULE | ORAL | 2 refills | Status: DC

## 2017-09-14 MED ORDER — ALPRAZOLAM 0.5 MG PO TABS: 0.5000 mg | ORAL_TABLET | Freq: Two times a day (BID) | ORAL | 2 refills | Status: DC | PRN

## 2017-09-14 MED ORDER — AMLODIPINE BESYLATE 5 MG PO TABS: ORAL_TABLET | ORAL | 2 refills | Status: DC

## 2017-09-14 MED ORDER — ACETAMINOPHEN-CODEINE #3 300-30 MG PO TABS: 1.0000 | ORAL_TABLET | Freq: Four times a day (QID) | ORAL | 0 refills | Status: DC | PRN

## 2017-09-14 MED ORDER — TRAZODONE HCL 50 MG PO TABS: 25.0000 mg | ORAL_TABLET | Freq: Every evening | ORAL | 3 refills | Status: DC | PRN

## 2017-09-14 NOTE — Assessment & Plan Note (Signed)
Continue with the Tylenol No. 3 as needed for her chronic abdominal pain.

## 2017-09-14 NOTE — Progress Notes (Signed)
Subjective:    Patient ID: Tiffany LamasSarah C Flowers, female    DOB: June 08, 1956, 61 y.o.   MRN: [id number]  Patient presents for Follow-up Here to follow-up chronic medical problems.  Medications reviewed  She is on chronic Tylenol No. 3 secondary to her chronic abdominal pain irritable bowel syndrome.  GAD -she is on Effexor as well as temazepam for sleep.  She takes alprazolam as needed for anxiety panic attacks.  Chronic insomnia- temzaepam does not work, often takes OTC sleep   Hypertension taking amlodipine as prescribed  Flu shot-/SHingrix- due   Has fallen a few itmes this year, had broken wrist in July, 1 of the occasion she was up getting some things off some high shelves and then she fell.  The other time she was just walking and lost her balance.  She denies any dizzy episodes  Has weirdsenation on left side of head that is been on and off for years.  Typically gets headaches on that side as well.  When she gets anxious she always feels a sensation on the left side of her head before her panic attacks. Denies taking any of her benzos before the episodes.  Review Of Systems: per above   GEN- denies fatigue, fever, weight loss,weakness, recent illness HEENT- denies eye drainage, change in vision, nasal discharge, CVS- denies chest pain, palpitations RESP- denies SOB, cough, wheeze ABD- denies N/V, change in stools, abd pain GU- denies dysuria, hematuria, dribbling, incontinence MSK- denies joint pain, muscle aches, injury Neuro-+ headache, dizziness, syncope, seizure activity       Objective:    BP 124/76 (BP Location: Right Arm, Patient Position: Sitting, Cuff Size: Normal)   Pulse 84   Temp 97.6 F (36.4 C) (Oral)   Resp 18   Wt 120 lb 9.6 oz (54.7 kg)   BMI 23.55 kg/m  GEN- NAD, alert and oriented x3 HEENT- PERRL, EOMI, non injected sclera, pink conjunctiva, MMM, oropharynx clear, tm clear bilat no effusion Neck- Supple, no thyromegaly, no bruit CVS- RRR, no  murmur RESP-CTAB ABD-NABS,soft,NT,ND Neuro-CNNI-XII in tact, no focal deficits  EXT- No edema Pulses- Radial, DP- 2+        Assessment & Plan:   Still unable to afford Bone density, taking Vitamin D and calcium    Problem List Items Addressed This Visit      Unprioritized   Insomnia   IBS (irritable bowel syndrome)   Panic disorder   Relevant Medications   venlafaxine XR (EFFEXOR-XR) 150 MG 24 hr capsule   ALPRAZolam (XANAX) 0.5 MG tablet   traZODone (DESYREL) 50 MG tablet   GAD (generalized anxiety disorder)    Continue with Effexor.  With regards to her insomnia that the temazepam is no longer working we will try her on trazodone which she states she has used in the past and it did okay. As needed Xanax.      Relevant Medications   venlafaxine XR (EFFEXOR-XR) 150 MG 24 hr capsule   ALPRAZolam (XANAX) 0.5 MG tablet   traZODone (DESYREL) 50 MG tablet   Essential hypertension, benign - Primary    Blood pressure is controlled.  No sign of orthostasis with her falls.      Relevant Medications   amLODipine (NORVASC) 5 MG tablet   Other Relevant Orders   CBC with Differential/Platelet   Comprehensive metabolic panel   TSH   Chronic pain syndrome    Continue with the Tylenol No. 3 as needed for her chronic abdominal pain.  Other Visit Diagnoses    Recurrent falls       concern for recurrent falls, also gets some left sided headaches on and off. obtain MRI brain No specific neurological deficits seen, no ataxia, no syncope   Encounter for vitamin deficiency screening       Relevant Orders   Vitamin D, 25-hydroxy   Need for prophylactic vaccination and inoculation against influenza       Relevant Orders   Varicella-zoster vaccine IM (Shingrix) (Completed)   Need for shingles vaccine       Relevant Orders   Flu Vaccine QUAD 6+ mos PF IM (Fluarix Quad PF) (Completed)      Note: This dictation was prepared with Dragon dictation along with smaller phrase  technology. Any transcriptional errors that result from this process are unintentional.

## 2017-09-14 NOTE — Patient Instructions (Addendum)
MRI to be done We will call with lab results Try the trazodone  Shingles shot/flu shot  F/U 6 months

## 2017-09-14 NOTE — Assessment & Plan Note (Signed)
Blood pressure is controlled.  No sign of orthostasis with her falls.

## 2017-09-14 NOTE — Assessment & Plan Note (Addendum)
Continue with Effexor.  With regards to her insomnia that the temazepam is no longer working we will try her on trazodone which she states she has used in the past and it did okay. As needed Xanax.

## 2017-09-17 LAB — COMPREHENSIVE METABOLIC PANEL
AG RATIO: 2 (calc) (ref 1.0–2.5)
ALKALINE PHOSPHATASE (APISO): 60 U/L (ref 33–130)
ALT: 10 U/L (ref 6–29)
AST: 16 U/L (ref 10–35)
Albumin: 4.3 g/dL (ref 3.6–5.1)
BUN: 10 mg/dL (ref 7–25)
CHLORIDE: 98 mmol/L (ref 98–110)
CO2: 27 mmol/L (ref 20–32)
CREATININE: 0.58 mg/dL (ref 0.50–0.99)
Calcium: 9.5 mg/dL (ref 8.6–10.4)
GLOBULIN: 2.1 g/dL (ref 1.9–3.7)
Glucose, Bld: 86 mg/dL (ref 65–99)
Potassium: 4.3 mmol/L (ref 3.5–5.3)
Sodium: 134 mmol/L — ABNORMAL LOW (ref 135–146)
Total Bilirubin: 0.3 mg/dL (ref 0.2–1.2)
Total Protein: 6.4 g/dL (ref 6.1–8.1)

## 2017-09-17 LAB — IRON, TOTAL/TOTAL IRON BINDING CAP
%SAT: 37 % (ref 11–50)
Iron: 103 ug/dL (ref 45–160)
TIBC: 282 ug/dL (ref 250–450)

## 2017-09-17 LAB — CBC WITH DIFFERENTIAL/PLATELET
BASOS ABS: 59 {cells}/uL (ref 0–200)
BASOS PCT: 0.9 %
EOS ABS: 92 {cells}/uL (ref 15–500)
Eosinophils Relative: 1.4 %
HCT: 33.5 % — ABNORMAL LOW (ref 35.0–45.0)
Hemoglobin: 11.5 g/dL — ABNORMAL LOW (ref 11.7–15.5)
Lymphs Abs: 2026 cells/uL (ref 850–3900)
MCH: 34.6 pg — AB (ref 27.0–33.0)
MCHC: 34.3 g/dL (ref 32.0–36.0)
MCV: 100.9 fL — AB (ref 80.0–100.0)
MPV: 9.8 fL (ref 7.5–12.5)
Monocytes Relative: 7.1 %
Neutro Abs: 3953 cells/uL (ref 1500–7800)
Neutrophils Relative %: 59.9 %
PLATELETS: 317 10*3/uL (ref 140–400)
RBC: 3.32 10*6/uL — ABNORMAL LOW (ref 3.80–5.10)
RDW: 12 % (ref 11.0–15.0)
TOTAL LYMPHOCYTE: 30.7 %
WBC: 6.6 10*3/uL (ref 3.8–10.8)
WBCMIX: 469 {cells}/uL (ref 200–950)

## 2017-09-17 LAB — VITAMIN D 25 HYDROXY (VIT D DEFICIENCY, FRACTURES): VIT D 25 HYDROXY: 53 ng/mL (ref 30–100)

## 2017-09-17 LAB — TEST AUTHORIZATION

## 2017-09-17 LAB — VITAMIN B12: Vitamin B-12: 408 pg/mL (ref 200–1100)

## 2017-09-17 LAB — TSH: TSH: 1.38 m[IU]/L (ref 0.40–4.50)

## 2017-09-24 ENCOUNTER — Encounter: Payer: Self-pay | Admitting: *Deleted

## 2017-09-24 ENCOUNTER — Other Ambulatory Visit: Payer: Self-pay | Admitting: Family Medicine

## 2017-10-08 ENCOUNTER — Other Ambulatory Visit: Payer: Self-pay | Admitting: Family Medicine

## 2017-10-09 ENCOUNTER — Other Ambulatory Visit: Payer: Self-pay | Admitting: *Deleted

## 2017-10-09 MED ORDER — ACETAMINOPHEN-CODEINE #3 300-30 MG PO TABS: 1.0000 | ORAL_TABLET | Freq: Four times a day (QID) | ORAL | 0 refills | Status: DC | PRN

## 2017-10-09 NOTE — Telephone Encounter (Signed)
Received fax requesting refill on APAP/Codeine.   Ok to refill??  Last office visit/ refill 09/14/2017.

## 2017-10-12 ENCOUNTER — Other Ambulatory Visit: Payer: 59

## 2017-11-21 ENCOUNTER — Other Ambulatory Visit: Payer: Self-pay

## 2017-11-21 MED ORDER — ALPRAZOLAM 0.5 MG PO TABS: 0.5000 mg | ORAL_TABLET | Freq: Two times a day (BID) | ORAL | 2 refills | Status: DC | PRN

## 2017-11-21 NOTE — Telephone Encounter (Signed)
Last OV 09/14/2017 Last refill 09/14/2017 Ok to refill?

## 2017-12-11 ENCOUNTER — Other Ambulatory Visit: Payer: Self-pay | Admitting: Family Medicine

## 2017-12-11 NOTE — Telephone Encounter (Signed)
Ok to refill??  Last office visit 09/14/2017.  Last refill 10/09/2017.

## 2017-12-24 ENCOUNTER — Other Ambulatory Visit: Payer: Self-pay | Admitting: Family Medicine

## 2017-12-25 ENCOUNTER — Other Ambulatory Visit: Payer: Self-pay | Admitting: Family Medicine

## 2018-01-11 ENCOUNTER — Encounter: Payer: Self-pay | Admitting: Podiatry

## 2018-01-11 ENCOUNTER — Ambulatory Visit: Payer: 59 | Admitting: Podiatry

## 2018-01-11 DIAGNOSIS — M205X2 Other deformities of toe(s) (acquired), left foot: Secondary | ICD-10-CM

## 2018-01-11 DIAGNOSIS — L84 Corns and callosities: Secondary | ICD-10-CM | POA: Diagnosis not present

## 2018-01-14 NOTE — Progress Notes (Signed)
Subjective: 62 year old female presents the office today for concerns of a painful skin lesion, corn to the left fifth toe.  She said the area is painful with pressure in shoes.  She has been under the care of Dr. Charlsie Merlesegal previously for this.  She has symptoms continue to continue.  No recent injury trauma or swelling or redness.  No other concerns. Denies any systemic complaints such as fevers, chills, nausea, vomiting. No acute changes since last appointment, and no other complaints at this time.   Objective: AAO x3, NAD DP/PT pulses palpable bilaterally, CRT less than 3 seconds Hyperkeratotic lesion present to the distal lateral aspect of the left fifth toe and this is due to underlying adductovarus deformity.  Upon debridement there is no underlying ulceration, drainage or any clinical signs of infection present.  No open lesions or pre-ulcerative lesions.  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  -There is a currently scheduled debride the hyperkeratotic lesion today without any complications or bleeding.  Offloading pads were dispensed.  Discussed shoe modifications be ultimately discussed with her arthroplasty of the IPJ with derotational skin plasty to help take pressure off the area.  She will likely do this in the future if needed.  She was to hold off on surgery for now. -Patient encouraged to call the office with any questions, concerns, change in symptoms.   Vivi BarrackMatthew R Wagoner DPM

## 2018-01-24 ENCOUNTER — Other Ambulatory Visit: Payer: Self-pay | Admitting: Family Medicine

## 2018-01-24 DIAGNOSIS — Z1231 Encounter for screening mammogram for malignant neoplasm of breast: Secondary | ICD-10-CM

## 2018-01-24 NOTE — Telephone Encounter (Signed)
Ok to refill??  Last office visit 09/14/2017.  Last refill 12/11/2017.

## 2018-02-19 ENCOUNTER — Other Ambulatory Visit: Payer: Self-pay | Admitting: Family Medicine

## 2018-02-19 NOTE — Telephone Encounter (Signed)
Ok to refill??  Last office visit 09/14/2017.  Last refill 11/21/2017, #2 refills.

## 2018-03-04 ENCOUNTER — Ambulatory Visit
Admission: RE | Admit: 2018-03-04 | Discharge: 2018-03-04 | Disposition: A | Discharge status: Home / Self Care | Payer: 59 | Source: Ambulatory Visit | Attending: Family Medicine | Admitting: Family Medicine

## 2018-03-04 DIAGNOSIS — Z1231 Encounter for screening mammogram for malignant neoplasm of breast: Secondary | ICD-10-CM | POA: Diagnosis not present

## 2018-03-15 ENCOUNTER — Ambulatory Visit: Payer: 59 | Admitting: Family Medicine

## 2018-03-15 ENCOUNTER — Other Ambulatory Visit: Payer: Self-pay

## 2018-03-15 ENCOUNTER — Encounter: Payer: Self-pay | Admitting: Family Medicine

## 2018-03-15 VITALS — BP 110/62 | HR 68 | Temp 97.9°F | Resp 14 | Ht 60.0 in | Wt 117.0 lb

## 2018-03-15 DIAGNOSIS — F17200 Nicotine dependence, unspecified, uncomplicated: Secondary | ICD-10-CM | POA: Diagnosis not present

## 2018-03-15 DIAGNOSIS — I10 Essential (primary) hypertension: Secondary | ICD-10-CM | POA: Diagnosis not present

## 2018-03-15 DIAGNOSIS — G894 Chronic pain syndrome: Secondary | ICD-10-CM

## 2018-03-15 DIAGNOSIS — F410 Panic disorder [episodic paroxysmal anxiety] without agoraphobia: Secondary | ICD-10-CM | POA: Diagnosis not present

## 2018-03-15 DIAGNOSIS — F5101 Primary insomnia: Secondary | ICD-10-CM | POA: Diagnosis not present

## 2018-03-15 LAB — COMPREHENSIVE METABOLIC PANEL
AG Ratio: 2.1 (calc) (ref 1.0–2.5)
ALKALINE PHOSPHATASE (APISO): 66 U/L (ref 33–130)
ALT: 12 U/L (ref 6–29)
AST: 18 U/L (ref 10–35)
Albumin: 4.8 g/dL (ref 3.6–5.1)
BUN/Creatinine Ratio: 8 (calc) (ref 6–22)
BUN: 5 mg/dL — AB (ref 7–25)
CHLORIDE: 97 mmol/L — AB (ref 98–110)
CO2: 27 mmol/L (ref 20–32)
CREATININE: 0.62 mg/dL (ref 0.50–0.99)
Calcium: 10.1 mg/dL (ref 8.6–10.4)
GLOBULIN: 2.3 g/dL (ref 1.9–3.7)
Glucose, Bld: 74 mg/dL (ref 65–99)
Potassium: 4.5 mmol/L (ref 3.5–5.3)
Sodium: 133 mmol/L — ABNORMAL LOW (ref 135–146)
Total Bilirubin: 0.4 mg/dL (ref 0.2–1.2)
Total Protein: 7.1 g/dL (ref 6.1–8.1)

## 2018-03-15 LAB — CBC WITH DIFFERENTIAL/PLATELET
Basophils Absolute: 69 cells/uL (ref 0–200)
Basophils Relative: 1.4 %
EOS ABS: 172 {cells}/uL (ref 15–500)
Eosinophils Relative: 3.5 %
HCT: 35.9 % (ref 35.0–45.0)
Hemoglobin: 12.6 g/dL (ref 11.7–15.5)
Lymphs Abs: 1911 cells/uL (ref 850–3900)
MCH: 34.1 pg — AB (ref 27.0–33.0)
MCHC: 35.1 g/dL (ref 32.0–36.0)
MCV: 97 fL (ref 80.0–100.0)
MPV: 9.5 fL (ref 7.5–12.5)
Monocytes Relative: 8 %
NEUTROS PCT: 48.1 %
Neutro Abs: 2357 cells/uL (ref 1500–7800)
PLATELETS: 310 10*3/uL (ref 140–400)
RBC: 3.7 10*6/uL — ABNORMAL LOW (ref 3.80–5.10)
RDW: 12.1 % (ref 11.0–15.0)
TOTAL LYMPHOCYTE: 39 %
WBC: 4.9 10*3/uL (ref 3.8–10.8)
WBCMIX: 392 {cells}/uL (ref 200–950)

## 2018-03-15 LAB — LIPID PANEL
CHOL/HDL RATIO: 2.6 (calc) (ref <5.0)
CHOLESTEROL: 210 mg/dL — AB (ref <200)
HDL: 82 mg/dL (ref >50)
LDL Cholesterol (Calc): 102 mg/dL (calc) — ABNORMAL HIGH
Non-HDL Cholesterol (Calc): 128 mg/dL (calc) (ref <130)
Triglycerides: 158 mg/dL — ABNORMAL HIGH (ref <150)

## 2018-03-15 MED ORDER — TRAZODONE HCL 100 MG PO TABS: 100.0000 mg | ORAL_TABLET | Freq: Every day | ORAL | 2 refills | Status: DC

## 2018-03-15 NOTE — Progress Notes (Signed)
   Subjective:    Patient ID: Tiffany Flowers, female    DOB: 06/26/1956, 62 y.o.   MRN: [id number]  Patient presents for Follow-up  Pt here to f/u chronic medical problems    Medications reviewed                                               HTN- taking BP meds as prescribed norvasc without any difficulty    Chronic abdominal pain/ Panic disorder/axiety- taking xanax, also on trazodone/ Effexor  at bedtime as needed for sleep,still having difficulty sleeping, would like to try higher dose of trazadone, this medication has worked the best for her so far Uses Tylenol #3 for her  abdominal pain flares    Continues to smoke       Review Of Systems:  GEN- denies fatigue, fever, weight loss,weakness, recent illness HEENT- denies eye drainage, change in vision, nasal discharge, CVS- denies chest pain, palpitations RESP- denies SOB, cough, wheeze ABD- denies N/V, change in stools, abd pain GU- denies dysuria, hematuria, dribbling, incontinence MSK- denies joint pain, muscle aches, injury Neuro- denies headache, dizziness, syncope, seizure activity       Objective:    BP 110/62   Pulse 68   Temp 97.9 F (36.6 C) (Oral)   Resp 14   Ht 5' (1.524 m)   Wt 117 lb (53.1 kg)   SpO2 98%   BMI 22.85 kg/m  GEN- NAD, alert and oriented x3 HEENT- PERRL, EOMI, non injected sclera, pink conjunctiva, MMM, oropharynx clear Neck- Supple, no thyromegaly CVS- RRR, no murmur RESP-CTAB ABD-NABS,soft,NT,ND EXT- No edema Pulses- Radial, DP- 2+        Assessment & Plan:      Problem List Items Addressed This Visit      Unprioritized   Chronic pain syndrome    Continue with tylenol #3 for her chronic abdominal pain which is tied to her panic disorder      Essential hypertension, benign    Controlled no changes to norvasc       Relevant Orders   CBC with Differential/Platelet (Completed)   Comprehensive metabolic panel (Completed)   Lipid panel (Completed)   Insomnia   Trazodone increased to 100mg  for sleep       Panic disorder    Continue effexor, trazodone for sleep Xanax       Relevant Medications   traZODone (DESYREL) 100 MG tablet   Tobacco use disorder - Primary    Continues to smoke counsled on cessation         Note: This dictation was prepared with Dragon dictation along with smaller Lobbyistphrase technology. Any transcriptional errors that result from this process are unintentional.

## 2018-03-15 NOTE — Patient Instructions (Addendum)
F/U 6 months for Physical  Trazodone increased to 100mg  once a day

## 2018-03-18 ENCOUNTER — Encounter: Payer: Self-pay | Admitting: Family Medicine

## 2018-03-18 NOTE — Assessment & Plan Note (Signed)
Continue effexor, trazodone for sleep Xanax

## 2018-03-18 NOTE — Assessment & Plan Note (Signed)
Trazodone increased to 100mg for sleep

## 2018-03-18 NOTE — Assessment & Plan Note (Signed)
Continue with tylenol #3 for her chronic abdominal pain which is tied to her panic disorder

## 2018-03-18 NOTE — Assessment & Plan Note (Signed)
Continues to smoke counsled on cessation

## 2018-03-18 NOTE — Assessment & Plan Note (Signed)
Controlled no changes to norvasc  

## 2018-03-21 ENCOUNTER — Encounter: Payer: Self-pay | Admitting: *Deleted

## 2018-03-26 ENCOUNTER — Other Ambulatory Visit: Payer: Self-pay | Admitting: Family Medicine

## 2018-03-26 NOTE — Telephone Encounter (Signed)
Ok to refill??  Last office visit 03/15/2018.  Last refill 01/24/2018.

## 2018-04-05 ENCOUNTER — Other Ambulatory Visit: Payer: Self-pay | Admitting: Family Medicine

## 2018-04-05 NOTE — Telephone Encounter (Signed)
Pt is requesting refill on Xanax   LOV: 03/15/18  LRF:   02/19/18

## 2018-05-13 ENCOUNTER — Other Ambulatory Visit: Payer: Self-pay | Admitting: Family Medicine

## 2018-05-13 NOTE — Telephone Encounter (Signed)
Ok to refill??  Last office visit 03/15/2018.  Last refill 03/26/2018.

## 2018-07-01 ENCOUNTER — Other Ambulatory Visit: Payer: Self-pay | Admitting: Family Medicine

## 2018-07-02 NOTE — Telephone Encounter (Signed)
Requesting refill    Tylenol #3  LOV: 03/15/18  LRF:   05/13/18

## 2018-07-16 ENCOUNTER — Other Ambulatory Visit: Payer: Self-pay | Admitting: Family Medicine

## 2018-07-16 NOTE — Telephone Encounter (Signed)
Ok to refill??  Last office visit 03/15/2018.  Last refill 04/06/2018, #2 refills.

## 2018-07-30 ENCOUNTER — Other Ambulatory Visit: Payer: Self-pay | Admitting: Family Medicine

## 2018-08-03 DIAGNOSIS — J019 Acute sinusitis, unspecified: Secondary | ICD-10-CM | POA: Diagnosis not present

## 2018-08-03 DIAGNOSIS — R05 Cough: Secondary | ICD-10-CM | POA: Diagnosis not present

## 2018-08-12 ENCOUNTER — Other Ambulatory Visit: Payer: Self-pay | Admitting: Family Medicine

## 2018-08-13 NOTE — Telephone Encounter (Signed)
Ok to refill??  Last office visit 03/15/2018.  Last refill 07/02/2018.

## 2018-08-23 ENCOUNTER — Other Ambulatory Visit: Payer: Self-pay | Admitting: Family Medicine

## 2018-08-23 NOTE — Telephone Encounter (Signed)
Ok to refill??  Last office visit 03/15/2018.  Last refill 07/16/2018.

## 2018-09-20 ENCOUNTER — Ambulatory Visit (INDEPENDENT_AMBULATORY_CARE_PROVIDER_SITE_OTHER): Payer: 59 | Admitting: Family Medicine

## 2018-09-20 ENCOUNTER — Other Ambulatory Visit: Payer: Self-pay

## 2018-09-20 ENCOUNTER — Encounter: Payer: Self-pay | Admitting: Family Medicine

## 2018-09-20 VITALS — BP 122/68 | HR 80 | Temp 97.9°F | Resp 12 | Ht 60.0 in | Wt 118.0 lb

## 2018-09-20 DIAGNOSIS — F410 Panic disorder [episodic paroxysmal anxiety] without agoraphobia: Secondary | ICD-10-CM

## 2018-09-20 DIAGNOSIS — Z0000 Encounter for general adult medical examination without abnormal findings: Secondary | ICD-10-CM

## 2018-09-20 DIAGNOSIS — F17200 Nicotine dependence, unspecified, uncomplicated: Secondary | ICD-10-CM

## 2018-09-20 DIAGNOSIS — Z1159 Encounter for screening for other viral diseases: Secondary | ICD-10-CM

## 2018-09-20 DIAGNOSIS — F411 Generalized anxiety disorder: Secondary | ICD-10-CM

## 2018-09-20 DIAGNOSIS — Z114 Encounter for screening for human immunodeficiency virus [HIV]: Secondary | ICD-10-CM

## 2018-09-20 DIAGNOSIS — I10 Essential (primary) hypertension: Secondary | ICD-10-CM | POA: Diagnosis not present

## 2018-09-20 DIAGNOSIS — Z23 Encounter for immunization: Secondary | ICD-10-CM | POA: Diagnosis not present

## 2018-09-20 MED ORDER — TRAZODONE HCL 100 MG PO TABS: 100.0000 mg | ORAL_TABLET | Freq: Every day | ORAL | 2 refills | Status: DC

## 2018-09-20 MED ORDER — ALPRAZOLAM 0.5 MG PO TABS: ORAL_TABLET | ORAL | 2 refills | Status: DC

## 2018-09-20 MED ORDER — AMLODIPINE BESYLATE 5 MG PO TABS: ORAL_TABLET | ORAL | 2 refills | Status: DC

## 2018-09-20 MED ORDER — ACETAMINOPHEN-CODEINE #3 300-30 MG PO TABS: ORAL_TABLET | ORAL | 0 refills | Status: DC

## 2018-09-20 NOTE — Assessment & Plan Note (Signed)
CPE done, prevention UTD Flu shot given Fasting labs

## 2018-09-20 NOTE — Progress Notes (Signed)
Subjective:    Patient ID: Tiffany Flowers, female    DOB: 01-Jul-1956, 62 y.o.   MRN: [id number]  Patient presents for Annual Exam (is fasting)    Pt here to for CPE.    [facility] with URI illness and sinusitis, [facility].    She took amoxicillin which gave her diarrhea and cramping, she used her tylenol #3 more than usual during that time    MDD and Anxiety- she stopped the effexor 2-3 months ago, state sshe doesn't feel any more anxious.She has not had panic attack since last Feb  She is still taking trazodone at bedtime which helps the most with her sleep. Xanax as needed for her anxiety    HTN- taking norvasc     Mammogram- UTD    Colonoscopy- UTD   PAP Smear UTD     Vaccines- UTD, Flu shot given today    Audit C neg/ Fall Screen Neg/ Depression Screen Neg  Review Of Systems:  GEN- denies fatigue, fever, weight loss,weakness, recent illness HEENT- denies eye drainage, change in vision, nasal discharge, CVS- denies chest pain, palpitations RESP- denies SOB, cough, wheeze ABD- denies N/V, change in stools, abd pain GU- denies dysuria, hematuria, dribbling, incontinence MSK- denies joint pain, muscle aches, injury Neuro- denies headache, dizziness, syncope, seizure activity       Objective:    BP 122/68   Pulse 80   Temp 97.9 F (36.6 C) (Oral)   Resp 12   Ht 5' (1.524 m)   Wt 118 lb (53.5 kg)   SpO2 98%   BMI 23.05 kg/m  GEN- NAD, alert and oriented x3 HEENT- PERRL, EOMI, non injected sclera, pink conjunctiva, MMM, oropharynx clear Neck- Supple, no thyromegaly CVS- RRR, no murmur RESP-CTAB ABD-NABS,soft,NT,ND Psych- normal affect and mood  EXT- No edema Pulses- Radial, DP- 2+        Assessment & Plan:      Problem List Items Addressed This Visit      Unprioritized   Essential hypertension, benign    Controlled no changes       Relevant Medications   amLODipine (NORVASC) 5 MG tablet   Other Relevant Orders   CBC with  Differential/Platelet   Lipid panel   GAD (generalized anxiety disorder)   Relevant Medications   ALPRAZolam (XANAX) 0.5 MG tablet   traZODone (DESYREL) 100 MG tablet   Panic disorder    Continue xanax prn,  Trazodone for sleep      Relevant Medications   ALPRAZolam (XANAX) 0.5 MG tablet   traZODone (DESYREL) 100 MG tablet   Routine general medical examination at a health care facility - Primary    CPE done, prevention UTD Flu shot given Fasting labs      Relevant Orders   Comprehensive metabolic panel   CBC with Differential/Platelet   Lipid panel   Tobacco use disorder    Other Visit Diagnoses    Need for immunization against influenza       Relevant Orders   Flu Vaccine QUAD 36+ mos IM (Completed)   Encounter for screening for HIV       Relevant Orders   HIV Antibody (routine testing w rflx)   Need for hepatitis C screening test       Relevant Orders   Hepatitis C antibody      Note: This dictation was prepared with Dragon dictation along with smaller phrase technology. Any transcriptional errors that result from this  process are unintentional.

## 2018-09-20 NOTE — Patient Instructions (Signed)
F/U 6 months

## 2018-09-20 NOTE — Assessment & Plan Note (Signed)
Continue xanax prn,  Trazodone for sleep

## 2018-09-20 NOTE — Assessment & Plan Note (Signed)
Controlled no changes 

## 2018-09-22 LAB — COMPREHENSIVE METABOLIC PANEL
AG RATIO: 2 (calc) (ref 1.0–2.5)
ALBUMIN MSPROF: 4.8 g/dL (ref 3.6–5.1)
ALT: 10 U/L (ref 6–29)
AST: 17 U/L (ref 10–35)
Alkaline phosphatase (APISO): 67 U/L (ref 33–130)
BUN/Creatinine Ratio: 8 (calc) (ref 6–22)
BUN: 5 mg/dL — ABNORMAL LOW (ref 7–25)
CALCIUM: 10.3 mg/dL (ref 8.6–10.4)
CHLORIDE: 99 mmol/L (ref 98–110)
CO2: 24 mmol/L (ref 20–32)
Creat: 0.59 mg/dL (ref 0.50–0.99)
GLOBULIN: 2.4 g/dL (ref 1.9–3.7)
Glucose, Bld: 97 mg/dL (ref 65–99)
POTASSIUM: 4.6 mmol/L (ref 3.5–5.3)
SODIUM: 136 mmol/L (ref 135–146)
TOTAL PROTEIN: 7.2 g/dL (ref 6.1–8.1)
Total Bilirubin: 0.4 mg/dL (ref 0.2–1.2)

## 2018-09-22 LAB — HEPATITIS C ANTIBODY
Hepatitis C Ab: NONREACTIVE
SIGNAL TO CUT-OFF: 0.02 (ref <1.00)

## 2018-09-22 LAB — CBC WITH DIFFERENTIAL/PLATELET
Absolute Monocytes: 377 cells/uL (ref 200–950)
Basophils Absolute: 72 cells/uL (ref 0–200)
Basophils Relative: 1.1 %
EOS PCT: 1.4 %
Eosinophils Absolute: 91 cells/uL (ref 15–500)
HEMATOCRIT: 36.6 % (ref 35.0–45.0)
Hemoglobin: 12.9 g/dL (ref 11.7–15.5)
LYMPHS ABS: 1755 {cells}/uL (ref 850–3900)
MCH: 34.4 pg — ABNORMAL HIGH (ref 27.0–33.0)
MCHC: 35.2 g/dL (ref 32.0–36.0)
MCV: 97.6 fL (ref 80.0–100.0)
MONOS PCT: 5.8 %
MPV: 10 fL (ref 7.5–12.5)
NEUTROS PCT: 64.7 %
Neutro Abs: 4206 cells/uL (ref 1500–7800)
PLATELETS: 329 10*3/uL (ref 140–400)
RBC: 3.75 10*6/uL — ABNORMAL LOW (ref 3.80–5.10)
RDW: 12.3 % (ref 11.0–15.0)
TOTAL LYMPHOCYTE: 27 %
WBC: 6.5 10*3/uL (ref 3.8–10.8)

## 2018-09-22 LAB — LIPID PANEL
Cholesterol: 201 mg/dL — ABNORMAL HIGH (ref <200)
HDL: 85 mg/dL (ref >50)
LDL Cholesterol (Calc): 99 mg/dL (calc)
Non-HDL Cholesterol (Calc): 116 mg/dL (calc) (ref <130)
Total CHOL/HDL Ratio: 2.4 (calc) (ref <5.0)
Triglycerides: 82 mg/dL (ref <150)

## 2018-09-22 LAB — HIV ANTIBODY (ROUTINE TESTING W REFLEX): HIV 1&2 Ab, 4th Generation: NONREACTIVE

## 2018-10-18 ENCOUNTER — Ambulatory Visit (INDEPENDENT_AMBULATORY_CARE_PROVIDER_SITE_OTHER): Payer: 59

## 2018-10-18 ENCOUNTER — Ambulatory Visit: Payer: 59

## 2018-10-18 ENCOUNTER — Ambulatory Visit: Payer: 59 | Admitting: Podiatry

## 2018-10-18 DIAGNOSIS — M79671 Pain in right foot: Secondary | ICD-10-CM

## 2018-10-18 DIAGNOSIS — M2042 Other hammer toe(s) (acquired), left foot: Secondary | ICD-10-CM

## 2018-10-18 DIAGNOSIS — M205X2 Other deformities of toe(s) (acquired), left foot: Secondary | ICD-10-CM | POA: Diagnosis not present

## 2018-10-18 DIAGNOSIS — M19071 Primary osteoarthritis, right ankle and foot: Secondary | ICD-10-CM

## 2018-10-18 DIAGNOSIS — J0100 Acute maxillary sinusitis, unspecified: Secondary | ICD-10-CM | POA: Insufficient documentation

## 2018-10-18 DIAGNOSIS — M722 Plantar fascial fibromatosis: Secondary | ICD-10-CM

## 2018-10-18 NOTE — Patient Instructions (Addendum)
Pre-Operative Instructions  Congratulations, you have decided to take an important step towards improving your quality of life.  You can be assured that the doctors and staff at [facility] will be with you every step of the way.  Here are some important things you should know:  1. Plan to be at the surgery center/hospital at least 1 (one) hour prior to your scheduled time, unless otherwise directed by the surgical center/hospital staff.  You must have a responsible adult accompany you, remain during the surgery and drive you home.  Make sure you have directions to the surgical center/hospital to ensure you arrive on time. 2. If you are having surgery at [facility] or [facility], you will need a copy of your medical history and physical form from your family physician within one month prior to the date of surgery. We will give you a form for your primary physician to complete.  3. We make every effort to accommodate the date you request for surgery.  However, there are times where surgery dates or times have to be moved.  We will contact you as soon as possible if a change in schedule is required.   4. No aspirin/ibuprofen for one week before surgery.  If you are on aspirin, any non-steroidal anti-inflammatory medications (Mobic, Aleve, Ibuprofen) should not be taken seven (7) days prior to your surgery.  You make take Tylenol for pain prior to surgery.  5. Medications - If you are taking daily heart and blood pressure medications, seizure, reflux, allergy, asthma, anxiety, pain or diabetes medications, make sure you notify the surgery center/hospital before the day of surgery so they can tell you which medications you should take or avoid the day of surgery. 6. No food or drink after midnight the night before surgery unless directed otherwise by surgical center/hospital staff. 7. No alcoholic beverages 24-hours prior to surgery.  No smoking 24-hours prior or 24-hours after  surgery. 8. Wear loose pants or shorts. They should be loose enough to fit over bandages, boots, and casts. 9. Don't wear slip-on shoes. Sneakers are preferred. 10. Bring your boot with you to the surgery center/hospital.  Also bring crutches or a walker if your physician has prescribed it for you.  If you do not have this equipment, it will be provided for you after surgery. 11. If you have not been contacted by the surgery center/hospital by the day before your surgery, call to confirm the date and time of your surgery. 12. Leave-time from work may vary depending on the type of surgery you have.  Appropriate arrangements should be made prior to surgery with your employer. 13. Prescriptions will be provided immediately following surgery by your doctor.  Fill these as soon as possible after surgery and take the medication as directed. Pain medications will not be refilled on weekends and must be approved by the doctor. 14. Remove nail polish on the operative foot and avoid getting pedicures prior to surgery. 15. Wash the night before surgery.  The night before surgery wash the foot and leg well with water and the antibacterial soap provided. Be sure to pay special attention to beneath the toenails and in between the toes.  Wash for at least three (3) minutes. Rinse thoroughly with water and dry well with a towel.  Perform this wash unless told not to do so by your physician.  Enclosed: 1 Ice pack (please put in freezer the night before surgery)   1 Hibiclens skin cleaner     Pre-op instructions  If you have any questions regarding the instructions, please do not hesitate to call our office.  [place]: [address], [place], [place] [address] -- [phone]  [place]: [address] [place], [place] [address] -- [phone]  [place]: [address]  [place], [place] [address] -- [phone]  [place]: [address], [address], [place], [place] [address] -- [phone]  Website:  [email or web address]    Plantar Fasciitis (Heel Spur Syndrome) with Rehab The plantar fascia is a fibrous, ligament-like, soft-tissue structure that spans the bottom of the foot. Plantar fasciitis is a condition that causes pain in the foot due to inflammation of the tissue. SYMPTOMS   Pain and tenderness on the underneath side of the foot.  Pain that worsens with standing or walking. CAUSES  Plantar fasciitis is caused by irritation and injury to the plantar fascia on the underneath side of the foot. Common mechanisms of injury include:  Direct trauma to bottom of the foot.  Damage to a small nerve that runs under the foot where the main fascia attaches to the heel bone.  Stress placed on the plantar fascia due to bone spurs. RISK INCREASES WITH:   Activities that place stress on the plantar fascia (running, jumping, pivoting, or cutting).  Poor strength and flexibility.  Improperly fitted shoes.  Tight calf muscles.  Flat feet.  Failure to warm-up properly before activity.  Obesity. PREVENTION  Warm up and stretch properly before activity.  Allow for adequate recovery between workouts.  Maintain physical fitness:  Strength, flexibility, and endurance.  Cardiovascular fitness.  Maintain a health body weight.  Avoid stress on the plantar fascia.  Wear properly fitted shoes, including arch supports for individuals who have flat feet.  PROGNOSIS  If treated properly, then the symptoms of plantar fasciitis usually resolve without surgery. However, occasionally surgery is necessary.  RELATED COMPLICATIONS   Recurrent symptoms that may result in a chronic condition.  Problems of the lower back that are caused by compensating for the injury, such as limping.  Pain or weakness of the foot during push-off following surgery.  Chronic inflammation, scarring, and partial or complete fascia tear, occurring more often from repeated injections.  TREATMENT   Treatment initially involves the use of ice and medication to help reduce pain and inflammation. The use of strengthening and stretching exercises may help reduce pain with activity, especially stretches of the Achilles tendon. These exercises may be performed at home or with a therapist. Your caregiver may recommend that you use heel cups of arch supports to help reduce stress on the plantar fascia. Occasionally, corticosteroid injections are given to reduce inflammation. If symptoms persist for greater than 6 months despite non-surgical (conservative), then surgery may be recommended.   MEDICATION   If pain medication is necessary, then nonsteroidal anti-inflammatory medications, such as aspirin and ibuprofen, or other minor pain relievers, such as acetaminophen, are often recommended.  Do not take pain medication within 7 days before surgery.  Prescription pain relievers may be given if deemed necessary by your caregiver. Use only as directed and only as much as you need.  Corticosteroid injections may be given by your caregiver. These injections should be reserved for the most serious cases, because they may only be given a certain number of times.  HEAT AND COLD  Cold treatment (icing) relieves pain and reduces inflammation. Cold treatment should be applied for 10 to 15 minutes every 2 to 3 hours for inflammation and pain and immediately after any  activity that aggravates your symptoms. Use ice packs or massage the area with a piece of ice (ice massage).  Heat treatment may be used prior to performing the stretching and strengthening activities prescribed by your caregiver, physical therapist, or athletic trainer. Use a heat pack or soak the injury in warm water.  SEEK IMMEDIATE MEDICAL CARE IF:  Treatment seems to offer no benefit, or the condition worsens.  Any medications produce adverse side effects.  EXERCISES- RANGE OF MOTION (ROM) AND STRETCHING EXERCISES - Plantar Fasciitis  (Heel Spur Syndrome) These exercises may help you when beginning to rehabilitate your injury. Your symptoms may resolve with or without further involvement from your physician, physical therapist or athletic trainer. While completing these exercises, remember:   Restoring tissue flexibility helps normal motion to return to the joints. This allows healthier, less painful movement and activity.  An effective stretch should be held for at least 30 seconds.  A stretch should never be painful. You should only feel a gentle lengthening or release in the stretched tissue.  RANGE OF MOTION - Toe Extension, Flexion  Sit with your right / left leg crossed over your opposite knee.  Grasp your toes and gently pull them back toward the top of your foot. You should feel a stretch on the bottom of your toes and/or foot.  Hold this stretch for 10 seconds.  Now, gently pull your toes toward the bottom of your foot. You should feel a stretch on the top of your toes and or foot.  Hold this stretch for 10 seconds. Repeat  times. Complete this stretch 3 times per day.   RANGE OF MOTION - Ankle Dorsiflexion, Active Assisted  Remove shoes and sit on a chair that is preferably not on a carpeted surface.  Place right / left foot under knee. Extend your opposite leg for support.  Keeping your heel down, slide your right / left foot back toward the chair until you feel a stretch at your ankle or calf. If you do not feel a stretch, slide your bottom forward to the edge of the chair, while still keeping your heel down.  Hold this stretch for 10 seconds. Repeat 3 times. Complete this stretch 2 times per day.   STRETCH  Gastroc, Standing  Place hands on wall.  Extend right / left leg, keeping the front knee somewhat bent.  Slightly point your toes inward on your back foot.  Keeping your right / left heel on the floor and your knee straight, shift your weight toward the wall, not allowing your back to  arch.  You should feel a gentle stretch in the right / left calf. Hold this position for 10 seconds. Repeat 3 times. Complete this stretch 2 times per day.  STRETCH  Soleus, Standing  Place hands on wall.  Extend right / left leg, keeping the other knee somewhat bent.  Slightly point your toes inward on your back foot.  Keep your right / left heel on the floor, bend your back knee, and slightly shift your weight over the back leg so that you feel a gentle stretch deep in your back calf.  Hold this position for 10 seconds. Repeat 3 times. Complete this stretch 2 times per day.  STRETCH  Gastrocsoleus, Standing  Note: This exercise can place a lot of stress on your foot and ankle. Please complete this exercise only if specifically instructed by your caregiver.   Place the ball of your right / left foot on a  step, keeping your other foot firmly on the same step.  Hold on to the wall or a rail for balance.  Slowly lift your other foot, allowing your body weight to press your heel down over the edge of the step.  You should feel a stretch in your right / left calf.  Hold this position for 10 seconds.  Repeat this exercise with a slight bend in your right / left knee. Repeat 3 times. Complete this stretch 2 times per day.   STRENGTHENING EXERCISES - Plantar Fasciitis (Heel Spur Syndrome)  These exercises may help you when beginning to rehabilitate your injury. They may resolve your symptoms with or without further involvement from your physician, physical therapist or athletic trainer. While completing these exercises, remember:   Muscles can gain both the endurance and the strength needed for everyday activities through controlled exercises.  Complete these exercises as instructed by your physician, physical therapist or athletic trainer. Progress the resistance and repetitions only as guided.  STRENGTH - Towel Curls  Sit in a chair positioned on a non-carpeted surface.  Place  your foot on a towel, keeping your heel on the floor.  Pull the towel toward your heel by only curling your toes. Keep your heel on the floor. Repeat 3 times. Complete this exercise 2 times per day.  STRENGTH - Ankle Inversion  Secure one end of a rubber exercise band/tubing to a fixed object (table, pole). Loop the other end around your foot just before your toes.  Place your fists between your knees. This will focus your strengthening at your ankle.  Slowly, pull your big toe up and in, making sure the band/tubing is positioned to resist the entire motion.  Hold this position for 10 seconds.  Have your muscles resist the band/tubing as it slowly pulls your foot back to the starting position. Repeat 3 times. Complete this exercises 2 times per day.  Document Released: 09/18/2005 Document Revised: 12/11/2011 Document Reviewed: 12/31/2008 [facility] Patient Information 2014 [place], [place].

## 2018-10-22 NOTE — Progress Notes (Signed)
Subjective: 63 year old female presents the office today for surgical consultation.  She states that she feels that the left fourth and fifth toes need to be straightened out because they are putting pressure on the toe causing discomfort.  This is been a chronic issue for her.  She states that she has tried offloading, padding, shoe modification without any significant improvement.  She also has some concerns of pain on her right second toe.  She states that at times she feels that the pain may be coming through her foot and she points on the second MPJ where she gets majority of discomfort at times.  It is intermittent.  No recent injury trauma.  She also gets some occasional discomfort in the bottom of her left heel.  Is worse in the morning she first gets up and gets better with activity.  She is concerned about possible gout.  This is been ongoing the last couple months.  No recent injury or trauma. Denies any systemic complaints such as fevers, chills, nausea, vomiting. No acute changes since last appointment, and no other complaints at this time.   Objective: AAO x3, NAD DP/PT pulses palpable bilaterally, CRT less than 3 seconds RIGHT foot: There is arthritic changes in the dorsal spurring present on the right second MPJ.  There is no palpable hardware present.  There is decreased range of motion crepitation with second MPJ range of motion.  No other areas of tenderness elicited on the right side. 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  There is tenderness palpation of this area most with wearing shoes and pressure.  Also some mild discomfort on the plantar medial tubercle of the calcaneus at the insertion plantar fascia.  No pain with lateral compression of calcaneus.  Plantar fascia, Achilles tendon appear to be intact. No open lesions or pre-ulcerative lesions.  No pain with calf compression, swelling,  warmth, erythema  Assessment: 63 year old female with primary concerns of pain on the left fourth and fifth toes/adductovarus; left heel pain, likely plantar fasciitis; right second MPJ arthritis  Plan: -All treatment options discussed with the patient including all alternatives, risks, complications.  -Etiology of symptoms were discussed -X-rays were obtained and reviewed with the patient.  Arthritic changes present on the right second MPJ.  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  1.  Toe deformity left fourth and fifth toes -We discussed both conservative as well as surgical options.  This time she was to proceed with surgical intervention.  We discussed with her hammertoe repair of the left fourth and fifth toes with K wire fixation of the fourth toe.  We discussed the surgery as well as the postoperative course and she wishes to proceed. -The incision placement as well as the postoperative course was discussed with the patient. I discussed risks of the surgery which include, but not limited to, infection, bleeding, pain, swelling, need for further surgery, delayed or nonhealing, painful or ugly scar, numbness or sensation changes, over/under correction, recurrence, transfer lesions, further deformity, recurrence of deformity, hardware failure, DVT/PE, loss of toe/foot. Patient understands these risks and wishes to proceed with surgery. The surgical consent was reviewed with the patient all 3 pages were signed. No promises or guarantees were given to the outcome of the procedure. All questions were answered to the best of my ability. Before the surgery the  patient was encouraged to call the office if there is any further questions. The surgery will be performed at the [facility] on an outpatient basis.  2.  Left heel pain; result of plantar fasciitis -I doubt this is gout given her symptoms.  Discussed stretching, ice  exercises daily as well as to modifications in orthotics.  Consider steroid injection.  3.  Right second MPJ arthritis -Hardware appears to be intact and I do not think this is a hardware issue most of her symptoms are result of the arthritis.  We discussed wearing stiffer soled shoe.  Monitor discussed surgical intervention but will hold off on that at this time.  Vivi Barrack DPM

## 2018-10-25 ENCOUNTER — Other Ambulatory Visit: Payer: Self-pay | Admitting: Podiatry

## 2018-10-25 DIAGNOSIS — M2042 Other hammer toe(s) (acquired), left foot: Secondary | ICD-10-CM

## 2018-10-27 ENCOUNTER — Other Ambulatory Visit: Payer: Self-pay | Admitting: Family Medicine

## 2018-10-28 ENCOUNTER — Other Ambulatory Visit: Payer: Self-pay | Admitting: Family Medicine

## 2018-10-28 NOTE — Telephone Encounter (Signed)
Ok to refill??  Last office visit/ refill 09/20/2018.

## 2018-11-13 ENCOUNTER — Encounter: Payer: Self-pay | Admitting: Podiatry

## 2018-11-13 ENCOUNTER — Other Ambulatory Visit: Payer: Self-pay | Admitting: Podiatry

## 2018-11-13 DIAGNOSIS — M2042 Other hammer toe(s) (acquired), left foot: Secondary | ICD-10-CM | POA: Diagnosis not present

## 2018-11-13 MED ORDER — PROMETHAZINE HCL 25 MG PO TABS: 25.0000 mg | ORAL_TABLET | Freq: Three times a day (TID) | ORAL | 0 refills | Status: DC | PRN

## 2018-11-13 MED ORDER — CEPHALEXIN 500 MG PO CAPS: 500.0000 mg | ORAL_CAPSULE | Freq: Three times a day (TID) | ORAL | 0 refills | Status: DC

## 2018-11-13 MED ORDER — HYDROCODONE-ACETAMINOPHEN 5-325 MG PO TABS: 1.0000 | ORAL_TABLET | ORAL | 0 refills | Status: AC | PRN

## 2018-11-13 NOTE — Progress Notes (Signed)
Pre-operative Note  Patient presents to the [facility] today for surgical intervention of the LEFT foot for hammertoe repair of the 4th and 5th digits. She will have a k-wire in the 4th toe. The surgical consent was reviewed with the patient and we discussed the procedure as well as the postoperative course. I again discussed all alternatives, risks, complications. I answered all of their questions to the best of my ability and they wish to proceed with surgery. No promises or guarantees were given as to the outcome of the surgery.   The surgical consent was signed.   Patient is NPO since midnight.  The patient does not have have a history of blood clots or bleeding disorders.   No further questions. Her husband is in the waiting room. She states not to talk to him after as he has been through this before with her other surgeries.   Post-op medications sent to the pharmacy.   Ovid Curd, DPM [facility]

## 2018-11-15 ENCOUNTER — Telehealth: Payer: Self-pay | Admitting: *Deleted

## 2018-11-15 NOTE — Telephone Encounter (Signed)
Called and spoke with the patient and patient states that she had some pain today and was a 10 this morning and has elevated and there has been no fever or nausea and no chills and patient has taken back and body bayer and I stated to call the  office at [phone]. Tiffany Flowers

## 2018-11-18 ENCOUNTER — Ambulatory Visit (INDEPENDENT_AMBULATORY_CARE_PROVIDER_SITE_OTHER): Payer: Self-pay | Admitting: Podiatry

## 2018-11-18 ENCOUNTER — Ambulatory Visit (INDEPENDENT_AMBULATORY_CARE_PROVIDER_SITE_OTHER): Payer: 59

## 2018-11-18 VITALS — Temp 98.8°F

## 2018-11-18 DIAGNOSIS — Z09 Encounter for follow-up examination after completed treatment for conditions other than malignant neoplasm: Secondary | ICD-10-CM | POA: Diagnosis not present

## 2018-11-18 DIAGNOSIS — M205X2 Other deformities of toe(s) (acquired), left foot: Secondary | ICD-10-CM

## 2018-11-26 NOTE — Progress Notes (Signed)
Subjective: Tiffany Flowers is a 63 y.o. is seen today in office s/p LEFT 4th and 5th hammertoe repair preformed on 11/13/2018.  She says overall pain is improving.  Max her pain level is 5/10.  She is wearing a surgical shoe.  Denies any systemic complaints such as fevers, chills, nausea, vomiting. No calf pain, chest pain, shortness of breath.   Objective: General: No acute distress, AAOx3  DP/PT pulses palpable 2/4, CRT < 3 sec to all digits.  Protective sensation intact. Motor function intact.  LEFT foot: Incision is well coapted without any evidence of dehiscence and sutures are intact. There is no surrounding erythema, ascending cellulitis, fluctuance, crepitus, malodor, drainage/purulence. There is mild edema around the surgical site. There is mild pain along the surgical site.  Toes and rectus position No other areas of tenderness to bilateral lower extremities.  No other open lesions or pre-ulcerative lesions.  No pain with calf compression, swelling, warmth, erythema.   Assessment and Plan:  Status post left foot hammertoe repair, doing well with no complications   -Treatment options discussed including all alternatives, risks, and complications -X-rays were obtained reviewed.  Hardware intact with fourth toe.  Status post hammertoe repair of the fourth and fifth digits. -Antibiotic ointment and a bandage applied to the toes.  Keep the dressing clean, dry, intact. -Remain in surgical shoe. -Ice/elevation -Pain medication as needed. -She is scheduled back to work tomorrow.  Discussed to keep the foot elevated and ice.  She can return to work as long as not any pain medication at work. -Monitor for any clinical signs or symptoms of infection and DVT/PE and directed to call the office immediately should any occur or go to the ER. -Follow-up as scheduled or sooner if any problems arise. In the meantime, encouraged to call the office with any questions, concerns, change in symptoms.    Ovid Curd, DPM

## 2018-11-28 ENCOUNTER — Ambulatory Visit (INDEPENDENT_AMBULATORY_CARE_PROVIDER_SITE_OTHER): Payer: Self-pay | Admitting: Podiatry

## 2018-11-28 DIAGNOSIS — Z09 Encounter for follow-up examination after completed treatment for conditions other than malignant neoplasm: Secondary | ICD-10-CM

## 2018-11-28 DIAGNOSIS — M205X2 Other deformities of toe(s) (acquired), left foot: Secondary | ICD-10-CM

## 2018-11-28 MED ORDER — HYDROCODONE-ACETAMINOPHEN 5-325 MG PO TABS: 1.0000 | ORAL_TABLET | Freq: Four times a day (QID) | ORAL | 0 refills | Status: DC | PRN

## 2018-11-29 NOTE — Progress Notes (Signed)
Subjective: Tiffany Flowers is a 63 y.o. is seen today in office s/p LEFT 4th and 5th hammertoe repair preformed on 11/13/2018.  She presents today for possible suture removal.  Overall she states that she is having some discomfort but mostly at nighttime she is asking for refill pain medication today.  She still wearing the cam boot.  She has been working. Denies any systemic complaints such as fevers, chills, nausea, vomiting. No calf pain, chest pain, shortness of breath.   Objective: General: No acute distress, AAOx3  DP/PT pulses palpable 2/4, CRT < 3 sec to all digits.  Protective sensation intact. Motor function intact.  LEFT foot: Incision is well coapted without any evidence of dehiscence and sutures are intact.  K wire intact of the fourth toe without any drainage or signs of infection.  There is no surrounding erythema, ascending cellulitis, fluctuance, crepitus, malodor, drainage/purulence. There is decreased edema around the surgical site. There is no significant pain along the surgical site today.  Toes and rectus position No other areas of tenderness to bilateral lower extremities.  No other open lesions or pre-ulcerative lesions.  No pain with calf compression, swelling, warmth, erythema.   Assessment and Plan:  Status post left foot hammertoe repair, doing well with no complications   -Treatment options discussed including all alternatives, risks, and complications -Today mostly sutures removed regularly with couple sutures intact.  Antibiotic ointment antibiotic was applied to the incision sites followed by dry sterile dressing.  Keep dressing clean, dry, intact. -Remain in surgical boot. -Ice/elevation -Pain medication as needed-refilled for her today. -Try to limit activity. -Monitor for any clinical signs or symptoms of infection and DVT/PE and directed to call the office immediately should any occur or go to the ER. -Follow-up as scheduled or sooner if any problems arise. In  the meantime, encouraged to call the office with any questions, concerns, change in symptoms.   Ovid Curd, DPM

## 2018-12-06 ENCOUNTER — Ambulatory Visit (INDEPENDENT_AMBULATORY_CARE_PROVIDER_SITE_OTHER): Payer: 59

## 2018-12-06 ENCOUNTER — Ambulatory Visit (INDEPENDENT_AMBULATORY_CARE_PROVIDER_SITE_OTHER): Payer: Self-pay | Admitting: Podiatry

## 2018-12-06 DIAGNOSIS — Z09 Encounter for follow-up examination after completed treatment for conditions other than malignant neoplasm: Secondary | ICD-10-CM

## 2018-12-06 DIAGNOSIS — M205X2 Other deformities of toe(s) (acquired), left foot: Secondary | ICD-10-CM | POA: Diagnosis not present

## 2018-12-08 NOTE — Progress Notes (Signed)
Subjective: Tiffany Flowers is a 63 y.o. is seen today in office s/p LEFT 4th and 5th hammertoe repair preformed on 11/13/2018.  She presents here for follow-up evaluation as well as procedure removal of the remaining sutures.  She is having no issues.  She is remained in the cam boot.  She is still working but she is trying keep her foot iced and elevated.  She has no other concerns today.  She denies any fevers, chills, nausea, vomiting.  No calf pain, chest pain, shortness of breath.  Objective: General: No acute distress, AAOx3  DP/PT pulses palpable 2/4, CRT < 3 sec to all digits.  Protective sensation intact. Motor function intact.  LEFT foot: Incision is well coapted without any evidence of dehiscence and sutures are intact.  K wire intact of the fourth toe without any drainage or signs of infection.  There is minimal edema there is no erythema or increase in warmth.  There is no ascending cellulitis.  Incisions appear to be doing well. No other open lesions or pre-ulcerative lesions.  No pain with calf compression, swelling, warmth, erythema.   Assessment and Plan:  Status post left foot hammertoe repair, doing well with no complications   -Treatment options discussed including all alternatives, risks, and complications -X-rays were obtained reviewed.  No evidence of acute fracture.  Hardware intact.  Status post arthroplasty of the fourth and fifth toes. -Today the remainder of the sutures removed without any complications the incision remained well coapted.  Steri-Strips were applied for reinforcement followed by antibiotic ointment and a bandage.  We left the K wire intact today.  Will remain in the cam boot for now continue ice elevation.  About 10 days we will repeat x-rays and likely with a K wire.    Vivi Barrack DPM

## 2018-12-09 ENCOUNTER — Telehealth: Payer: Self-pay | Admitting: Podiatry

## 2018-12-09 NOTE — Telephone Encounter (Signed)
Called and left a message for the patient if patient would like to come in and see our nurse to get the bandage changed out and if any concerns or questions to call the [place] office at [phone]. Misty Stanley

## 2018-12-09 NOTE — Telephone Encounter (Signed)
I have a pin in my 4th toe and its sticking out some. I was wondering if I can come in and get it re-bandaged as I don't have a follow up appointment until 3/19.

## 2018-12-19 ENCOUNTER — Ambulatory Visit (INDEPENDENT_AMBULATORY_CARE_PROVIDER_SITE_OTHER): Payer: Self-pay | Admitting: Podiatry

## 2018-12-19 ENCOUNTER — Other Ambulatory Visit: Payer: Self-pay

## 2018-12-19 ENCOUNTER — Ambulatory Visit (INDEPENDENT_AMBULATORY_CARE_PROVIDER_SITE_OTHER): Payer: 59

## 2018-12-19 ENCOUNTER — Other Ambulatory Visit: Payer: Self-pay | Admitting: Family Medicine

## 2018-12-19 ENCOUNTER — Ambulatory Visit: Payer: 59

## 2018-12-19 DIAGNOSIS — M205X2 Other deformities of toe(s) (acquired), left foot: Secondary | ICD-10-CM | POA: Diagnosis not present

## 2018-12-19 DIAGNOSIS — Z09 Encounter for follow-up examination after completed treatment for conditions other than malignant neoplasm: Secondary | ICD-10-CM | POA: Diagnosis not present

## 2018-12-19 NOTE — Telephone Encounter (Signed)
Requesting refill    Tylenol #3  LOV: 09/20/18  LRF:  10/28/18

## 2018-12-19 NOTE — Progress Notes (Signed)
She presents today date of surgery 09/13/2019 patient of Dr. Mindi Slicker hammertoe repair with K wire fourth left and a derotational arthroplasty fifth left.  She denies fever chills nausea vomiting muscle aches pains.  Objective: K wires intact appears to be slid out.  Pulses are palpable there is no erythema edema cellulitis drainage or odor.  Toes are rectus.  Radiographs taken today demonstrate a rectus toes K wires intact.  Assessment: Well-healing surgical toes fourth fifth left.  Plan: Remove the wire today her to start washing this in a couple of days demonstrated how to dress the toes daily we will follow-up with her in a couple of weeks.

## 2019-01-02 ENCOUNTER — Encounter: Payer: 59 | Admitting: Podiatry

## 2019-01-02 ENCOUNTER — Encounter: Payer: Self-pay | Admitting: Podiatry

## 2019-01-02 ENCOUNTER — Ambulatory Visit (INDEPENDENT_AMBULATORY_CARE_PROVIDER_SITE_OTHER): Payer: 59

## 2019-01-02 ENCOUNTER — Ambulatory Visit (INDEPENDENT_AMBULATORY_CARE_PROVIDER_SITE_OTHER): Payer: 59 | Admitting: Podiatry

## 2019-01-02 ENCOUNTER — Other Ambulatory Visit: Payer: Self-pay

## 2019-01-02 DIAGNOSIS — M205X2 Other deformities of toe(s) (acquired), left foot: Secondary | ICD-10-CM | POA: Diagnosis not present

## 2019-01-02 DIAGNOSIS — Z09 Encounter for follow-up examination after completed treatment for conditions other than malignant neoplasm: Secondary | ICD-10-CM

## 2019-01-06 NOTE — Progress Notes (Signed)
Subjective: Tiffany Flowers is a 63 y.o. is seen today in office s/p LEFT 4th and 5th hammertoe repair preformed on 11/13/2018.  Overall she states that she is doing well and her pain is controlled.  She is concerned that her box of her fourth toe is starting to turn again since the wire came out.  No recent injury.  She has no other concerns today.  She denies any fevers, chills, nausea, vomiting.  No calf pain, chest pain, shortness of breath.  Objective: General: No acute distress, AAOx3  DP/PT pulses palpable 2/4, CRT < 3 sec to all digits.  Protective sensation intact. Motor function intact.  LEFT foot: Incision is well coapted without any evidence of dehiscence and scar is formed.  There is minimal edema to the toe there is no erythema present warmth.  Minimal discomfort of the toes.  Fifth toes and rectus position.  The fourth toe started become slightly in adductovarus position.  No other open lesions or pre-ulcerative lesions.  No pain with calf compression, swelling, warmth, erythema.   Assessment and Plan:  Status post left foot hammertoe repair  -Treatment options discussed including all alternatives, risks, and complications -X-rays were obtained reviewed.  No evidence of acute fracture.  There is mild reoccurrence of the adductus of the 4th digit DIPJ.  I dispensed a Darco splint in order to help hold the toe in rectus position.  She can wear this when in shoes.  -Continue ice elevate. -She is not taking any pain medication. -Gradually increase activity level  RTC 6 weeks or sooner if needed  Vivi Barrack DPM

## 2019-01-13 ENCOUNTER — Other Ambulatory Visit: Payer: Self-pay | Admitting: Family Medicine

## 2019-01-14 NOTE — Telephone Encounter (Signed)
Ok to refill??  Last office visit/ refill 09/20/2018, #2 refills.

## 2019-02-13 ENCOUNTER — Other Ambulatory Visit: Payer: Self-pay | Admitting: Family Medicine

## 2019-02-13 DIAGNOSIS — Z1231 Encounter for screening mammogram for malignant neoplasm of breast: Secondary | ICD-10-CM

## 2019-02-14 ENCOUNTER — Ambulatory Visit (INDEPENDENT_AMBULATORY_CARE_PROVIDER_SITE_OTHER): Payer: 59

## 2019-02-14 ENCOUNTER — Other Ambulatory Visit: Payer: Self-pay

## 2019-02-14 ENCOUNTER — Ambulatory Visit (INDEPENDENT_AMBULATORY_CARE_PROVIDER_SITE_OTHER): Payer: 59 | Admitting: Podiatry

## 2019-02-14 DIAGNOSIS — M205X2 Other deformities of toe(s) (acquired), left foot: Secondary | ICD-10-CM | POA: Diagnosis not present

## 2019-02-14 NOTE — Progress Notes (Signed)
Subjective: Tiffany Flowers is a 63 y.o. is seen today in office s/p LEFT 4th and 5th hammertoe repair preformed on 11/13/2018.  She states that she is not experiencing any pain she is back to her regular shoe.  She has been wearing the splint.  The fourth toe is still not completely straight but not causing any discomfort. She denies any fevers, chills, nausea, vomiting.  No calf pain, chest pain, shortness of breath.  Objective: General: No acute distress, AAOx3  DP/PT pulses palpable 2/4, CRT < 3 sec to all digits.  Protective sensation intact. Motor function intact.  LEFT foot: Incision is well coapted without any evidence of dehiscence and scar is formed.  Mild reoccurrence with arthrodesis of the fourth toe.  There is no pain to the area there is no open lesions or pre-ulcerative areas.  There is minimal edema to the toe.  No erythema or warmth.  No signs of infection. No other open lesions or pre-ulcerative lesions.  No pain with calf compression, swelling, warmth, erythema.   Assessment and Plan:  Status post left foot hammertoe repair  -Treatment options discussed including all alternatives, risks, and complications -X-rays were obtained reviewed.  No evidence of acute fracture.  There is mild reoccurrence of the adductus of the 4th digit DIPJ.  -At this time she is doing well and not experiencing any discomfort.  Although there is no  Pain she is able to evaluate her shoes and the pain is present prior to surgery has resolved.  I do want her to continue the splint for some time to help with swelling rectus position.  I am would discharge her from the postoperative care at this time as I do encourage her to call any questions or concerns.  Vivi Barrack DPM

## 2019-02-17 NOTE — Progress Notes (Signed)
This encounter was created in error - please disregard.

## 2019-02-18 ENCOUNTER — Other Ambulatory Visit: Payer: Self-pay | Admitting: Family Medicine

## 2019-02-18 NOTE — Telephone Encounter (Signed)
Ok to refill??  Last office visit 09/20/2018.  Last refill 12/19/2018.

## 2019-03-21 ENCOUNTER — Encounter: Payer: Self-pay | Admitting: Family Medicine

## 2019-03-21 ENCOUNTER — Other Ambulatory Visit: Payer: Self-pay

## 2019-03-21 ENCOUNTER — Ambulatory Visit: Payer: 59 | Admitting: Family Medicine

## 2019-03-21 VITALS — BP 128/74 | HR 84 | Temp 98.9°F | Resp 12 | Ht 60.0 in | Wt 118.0 lb

## 2019-03-21 DIAGNOSIS — G894 Chronic pain syndrome: Secondary | ICD-10-CM | POA: Diagnosis not present

## 2019-03-21 DIAGNOSIS — F411 Generalized anxiety disorder: Secondary | ICD-10-CM | POA: Diagnosis not present

## 2019-03-21 DIAGNOSIS — I10 Essential (primary) hypertension: Secondary | ICD-10-CM

## 2019-03-21 DIAGNOSIS — F5101 Primary insomnia: Secondary | ICD-10-CM

## 2019-03-21 MED ORDER — ACETAMINOPHEN-CODEINE #3 300-30 MG PO TABS: ORAL_TABLET | ORAL | 0 refills | Status: DC

## 2019-03-21 MED ORDER — ESCITALOPRAM OXALATE 5 MG PO TABS: 5.0000 mg | ORAL_TABLET | Freq: Every day | ORAL | 3 refills | Status: DC

## 2019-03-21 MED ORDER — AMLODIPINE BESYLATE 5 MG PO TABS: ORAL_TABLET | ORAL | 2 refills | Status: DC

## 2019-03-21 MED ORDER — TRAZODONE HCL 100 MG PO TABS: 100.0000 mg | ORAL_TABLET | Freq: Every day | ORAL | 2 refills | Status: DC

## 2019-03-21 MED ORDER — ALPRAZOLAM 0.5 MG PO TABS: ORAL_TABLET | ORAL | 2 refills | Status: DC

## 2019-03-21 NOTE — Patient Instructions (Addendum)
Start lexpro 5mg  at bedtime F/U 6 MONTHS For Physical

## 2019-03-21 NOTE — Progress Notes (Signed)
   Subjective:    Patient ID: Tiffany Flowers, female    DOB: 10-21-1955, 63 y.o.   MRN: [id number]  Patient presents for Follow-up (is fasting)  Pt here to f/u chronic medical problems   Medications reviewed.    GAD- taking xanax daily due to high anxiety and stress with things going on in the world and work. intrested in trying something else to help calm her nerves, was on effexor in the past for years but that did cause her GI upset at times and has chronic abd pain   Chronic abd pain- taking the tylenol with codiene as needed    Chronic insomnia- taking trazodone  Review Of Systems:  GEN- denies fatigue, fever, weight loss,weakness, recent illness HEENT- denies eye drainage, change in vision, nasal discharge, CVS- denies chest pain, palpitations RESP- denies SOB, cough, wheeze ABD- denies N/V, change in stools, abd pain GU- denies dysuria, hematuria, dribbling, incontinence MSK- denies joint pain, muscle aches, injury Neuro- denies headache, dizziness, syncope, seizure activity       Objective:    BP 128/74   Pulse 84   Temp 98.9 F (37.2 C) (Oral)   Resp 12   Ht 5' (1.524 m)   Wt 118 lb (53.5 kg)   SpO2 97%   BMI 23.05 kg/m  GEN- NAD, alert and oriented x3 HEENT- PERRL, EOMI, non injected sclera, pink conjunctiva, MMM, oropharynx clear Neck- Supple, no thyromegaly CVS- RRR, no murmur RESP-CTAB ABD-NABS,soft,NT,ND Psych- normal  Affect and mood EXT- No edema Pulses- Radial, DP- 2+        Assessment & Plan:      Problem List Items Addressed This Visit      Unprioritized   Chronic pain syndrome - Primary    No change to tylenol #3      Relevant Medications   escitalopram (LEXAPRO) 5 MG tablet   traZODone (DESYREL) 100 MG tablet   acetaminophen-codeine (TYLENOL #3) 300-30 MG tablet   Essential hypertension, benign    Controlled no changes to meds      Relevant Medications   amLODipine (NORVASC) 5 MG tablet   Other Relevant Orders   CBC with  Differential/Platelet (Completed)   Comprehensive metabolic panel (Completed)   Lipid panel (Completed)   GAD (generalized anxiety disorder)    Try lexapro 5mg  once a day  She will call in a few weeks see how she is doing Continue xanax      Relevant Medications   escitalopram (LEXAPRO) 5 MG tablet   traZODone (DESYREL) 100 MG tablet   ALPRAZolam (XANAX) 0.5 MG tablet   Insomnia    Continue trazodone         Note: This dictation was prepared with Dragon dictation along with smaller phrase technology. Any transcriptional errors that result from this process are unintentional.

## 2019-03-22 LAB — COMPREHENSIVE METABOLIC PANEL
AG Ratio: 2 (calc) (ref 1.0–2.5)
ALT: 9 U/L (ref 6–29)
AST: 17 U/L (ref 10–35)
Albumin: 4.6 g/dL (ref 3.6–5.1)
Alkaline phosphatase (APISO): 67 U/L (ref 37–153)
BUN: 10 mg/dL (ref 7–25)
CO2: 22 mmol/L (ref 20–32)
Calcium: 9.9 mg/dL (ref 8.6–10.4)
Chloride: 98 mmol/L (ref 98–110)
Creat: 0.7 mg/dL (ref 0.50–0.99)
Globulin: 2.3 g/dL (calc) (ref 1.9–3.7)
Glucose, Bld: 87 mg/dL (ref 65–99)
Potassium: 4.3 mmol/L (ref 3.5–5.3)
Sodium: 133 mmol/L — ABNORMAL LOW (ref 135–146)
Total Bilirubin: 0.4 mg/dL (ref 0.2–1.2)
Total Protein: 6.9 g/dL (ref 6.1–8.1)

## 2019-03-22 LAB — LIPID PANEL
Cholesterol: 203 mg/dL — ABNORMAL HIGH (ref <200)
HDL: 74 mg/dL (ref >=50)
LDL Cholesterol (Calc): 97 mg/dL (calc)
Non-HDL Cholesterol (Calc): 129 mg/dL (calc) (ref <130)
Total CHOL/HDL Ratio: 2.7 (calc) (ref <5.0)
Triglycerides: 220 mg/dL — ABNORMAL HIGH (ref <150)

## 2019-03-22 LAB — CBC WITH DIFFERENTIAL/PLATELET
Absolute Monocytes: 474 cells/uL (ref 200–950)
Basophils Absolute: 58 cells/uL (ref 0–200)
Basophils Relative: 0.9 %
Eosinophils Absolute: 198 cells/uL (ref 15–500)
Eosinophils Relative: 3.1 %
HCT: 37.4 % (ref 35.0–45.0)
Hemoglobin: 13 g/dL (ref 11.7–15.5)
Lymphs Abs: 1958 cells/uL (ref 850–3900)
MCH: 34.6 pg — ABNORMAL HIGH (ref 27.0–33.0)
MCHC: 34.8 g/dL (ref 32.0–36.0)
MCV: 99.5 fL (ref 80.0–100.0)
MPV: 10.3 fL (ref 7.5–12.5)
Monocytes Relative: 7.4 %
Neutro Abs: 3712 cells/uL (ref 1500–7800)
Neutrophils Relative %: 58 %
Platelets: 286 10*3/uL (ref 140–400)
RBC: 3.76 10*6/uL — ABNORMAL LOW (ref 3.80–5.10)
RDW: 11.8 % (ref 11.0–15.0)
Total Lymphocyte: 30.6 %
WBC: 6.4 10*3/uL (ref 3.8–10.8)

## 2019-03-23 ENCOUNTER — Encounter: Payer: Self-pay | Admitting: Family Medicine

## 2019-03-23 NOTE — Assessment & Plan Note (Signed)
Try lexapro 5mg  once a day  She will call in a few weeks see how she is doing Continue xanax

## 2019-03-23 NOTE — Assessment & Plan Note (Signed)
Continue trazodone 

## 2019-03-23 NOTE — Assessment & Plan Note (Signed)
Controlled no changes to meds 

## 2019-03-23 NOTE — Assessment & Plan Note (Signed)
No change to tylenol #3

## 2019-04-07 ENCOUNTER — Ambulatory Visit: Payer: 59

## 2019-04-14 ENCOUNTER — Ambulatory Visit
Admission: RE | Admit: 2019-04-14 | Discharge: 2019-04-14 | Disposition: A | Discharge status: Home / Self Care | Payer: 59 | Source: Ambulatory Visit | Attending: Family Medicine | Admitting: Family Medicine

## 2019-04-14 ENCOUNTER — Other Ambulatory Visit: Payer: Self-pay

## 2019-04-14 DIAGNOSIS — Z1231 Encounter for screening mammogram for malignant neoplasm of breast: Secondary | ICD-10-CM

## 2019-04-18 ENCOUNTER — Other Ambulatory Visit: Payer: Self-pay | Admitting: Family Medicine

## 2019-04-18 NOTE — Telephone Encounter (Signed)
Last office visit: 03/21/2019 Last refilled:03/21/2019 

## 2019-05-29 ENCOUNTER — Other Ambulatory Visit: Payer: Self-pay | Admitting: Family Medicine

## 2019-05-29 NOTE — Telephone Encounter (Signed)
Ok to refill??  Last office visit/ refill 03/21/2019. 

## 2019-07-21 ENCOUNTER — Other Ambulatory Visit: Payer: Self-pay | Admitting: Family Medicine

## 2019-07-21 NOTE — Telephone Encounter (Signed)
Ok to refill??  Last office visit 03/21/2019.  Last refill 05/30/2019. 

## 2019-08-27 ENCOUNTER — Other Ambulatory Visit: Payer: Self-pay | Admitting: *Deleted

## 2019-08-27 MED ORDER — AMLODIPINE BESYLATE 5 MG PO TABS: ORAL_TABLET | ORAL | 2 refills | Status: DC

## 2019-09-11 ENCOUNTER — Other Ambulatory Visit: Payer: Self-pay | Admitting: *Deleted

## 2019-09-11 NOTE — Telephone Encounter (Signed)
Received fax requesting refill on APAP/ Codeine.   Ok to refill??  Last office visit 03/21/2019.  Last refill 07/21/2019.

## 2019-09-12 ENCOUNTER — Encounter: Payer: 59 | Admitting: Family Medicine

## 2019-09-12 MED ORDER — ACETAMINOPHEN-CODEINE #3 300-30 MG PO TABS: 1.0000 | ORAL_TABLET | Freq: Four times a day (QID) | ORAL | 0 refills | Status: DC | PRN

## 2019-10-24 ENCOUNTER — Other Ambulatory Visit: Payer: Self-pay | Admitting: Family Medicine

## 2019-10-24 NOTE — Telephone Encounter (Signed)
Requested Prescriptions   Pending Prescriptions Disp Refills  . ALPRAZolam (XANAX) 0.5 MG tablet [Pharmacy Med Name: ALPRAZOLAM 0.5MG  TABLETS] 60 tablet 0    Sig: TAKE 1 TABLET(0.5 MG) BY MOUTH TWICE DAILY AS NEEDED FOR ANXIETY    Last OV 03/21/2019   Last written 04/18/2019

## 2019-11-16 ENCOUNTER — Other Ambulatory Visit: Payer: Self-pay | Admitting: Family Medicine

## 2019-11-17 NOTE — Telephone Encounter (Signed)
Ok to refill APAP #3??  Last office visit 03/21/2019.  Last refill 09/12/2019.

## 2019-12-08 ENCOUNTER — Other Ambulatory Visit: Payer: Self-pay | Admitting: Family Medicine

## 2019-12-08 NOTE — Telephone Encounter (Signed)
Ok to refill??  Last office visit 03/21/2019.  Last refill 10/24/2019, #1 refill.

## 2020-01-02 ENCOUNTER — Encounter: Payer: 59 | Admitting: Family Medicine

## 2020-01-13 ENCOUNTER — Other Ambulatory Visit: Payer: Self-pay

## 2020-01-13 ENCOUNTER — Ambulatory Visit (INDEPENDENT_AMBULATORY_CARE_PROVIDER_SITE_OTHER): Payer: 59 | Admitting: Family Medicine

## 2020-01-13 ENCOUNTER — Encounter: Payer: Self-pay | Admitting: Family Medicine

## 2020-01-13 VITALS — BP 114/62 | HR 90 | Temp 97.9°F | Resp 14 | Ht 60.0 in | Wt 114.0 lb

## 2020-01-13 DIAGNOSIS — F410 Panic disorder [episodic paroxysmal anxiety] without agoraphobia: Secondary | ICD-10-CM

## 2020-01-13 DIAGNOSIS — Z0001 Encounter for general adult medical examination with abnormal findings: Secondary | ICD-10-CM | POA: Diagnosis not present

## 2020-01-13 DIAGNOSIS — I10 Essential (primary) hypertension: Secondary | ICD-10-CM | POA: Diagnosis not present

## 2020-01-13 DIAGNOSIS — K580 Irritable bowel syndrome with diarrhea: Secondary | ICD-10-CM

## 2020-01-13 DIAGNOSIS — M79672 Pain in left foot: Secondary | ICD-10-CM | POA: Diagnosis not present

## 2020-01-13 DIAGNOSIS — F411 Generalized anxiety disorder: Secondary | ICD-10-CM

## 2020-01-13 DIAGNOSIS — Z124 Encounter for screening for malignant neoplasm of cervix: Secondary | ICD-10-CM

## 2020-01-13 DIAGNOSIS — R42 Dizziness and giddiness: Secondary | ICD-10-CM

## 2020-01-13 DIAGNOSIS — G8929 Other chronic pain: Secondary | ICD-10-CM

## 2020-01-13 DIAGNOSIS — Z0000 Encounter for general adult medical examination without abnormal findings: Secondary | ICD-10-CM

## 2020-01-13 DIAGNOSIS — N6310 Unspecified lump in the right breast, unspecified quadrant: Secondary | ICD-10-CM

## 2020-01-13 MED ORDER — AMLODIPINE BESYLATE 5 MG PO TABS: ORAL_TABLET | ORAL | 2 refills | Status: AC

## 2020-01-13 MED ORDER — TRAZODONE HCL 100 MG PO TABS: ORAL_TABLET | ORAL | 2 refills | Status: DC

## 2020-01-13 MED ORDER — ALPRAZOLAM 0.5 MG PO TABS: ORAL_TABLET | ORAL | 2 refills | Status: AC

## 2020-01-13 MED ORDER — ACETAMINOPHEN-CODEINE #3 300-30 MG PO TABS: ORAL_TABLET | ORAL | 0 refills | Status: AC

## 2020-01-13 MED ORDER — MECLIZINE HCL 12.5 MG PO TABS: 12.5000 mg | ORAL_TABLET | Freq: Three times a day (TID) | ORAL | 1 refills | Status: AC | PRN

## 2020-01-13 MED ORDER — HYDROCORTISONE ACETATE 25 MG RE SUPP: 25.0000 mg | Freq: Two times a day (BID) | RECTAL | 1 refills | Status: AC | PRN

## 2020-01-13 NOTE — Progress Notes (Signed)
Subjective:    Patient ID: Tiffany Flowers, female    DOB: 1956-02-26, 64 y.o.   MRN: [id number]  Patient presents for Gynecologic Exam (is not fasting)   Pt  For CPE. Medications reviewed  She is moving to [facility] full time   She has had intermittant episodes of dizziness, at times especially when she moves her head to the left.    Requested a medicine for Vertigo    GAD- she uses xanax with driving, needed 7-4Q day to help her get through the day and now the move and stress at work trying to wrap things up   Chronic GI pain- worse in the morning- pain worse in the morning with loose bowels over the past 6 months , if she doesn't take her tylenol #3 during the day   Or pain with eating   Hemorroids pain, has tried OTC meds, request   Left heel pain and side on lateral side off, improves after walking , no injury    PAP Smear due   COVID 19 vaccine #1 done     Immunizations: TDAP/ shingrix/ flu       COlonoscopy done  2013  Mammogram UTD       Review Of Systems:  GEN- denies fatigue, fever, weight loss,weakness, recent illness HEENT- denies eye drainage, change in vision, nasal discharge, CVS- denies chest pain, palpitations RESP- denies SOB, cough, wheeze ABD- denies N/V, change in stools, abd pain GU- denies dysuria, hematuria, dribbling, incontinence MSK- denies joint pain, muscle aches, injury Neuro- denies headache, dizziness, syncope, seizure activity       Objective:    BP 114/62   Pulse 90   Temp 97.9 F (36.6 C) (Temporal)   Resp 14   Ht 5' (1.524 m)   Wt 114 lb (51.7 kg)   SpO2 96%   BMI 22.26 kg/m  GEN- NAD, alert and oriented x3 HEENT- PERRL, EOMI, non injected sclera, pink conjunctiva, MMM, oropharynx clear Neck- Supple, no thyromegaly Breast- normal symmetry, no nipple inversion,no nipple drainage, Right breast lateral side of nipple small marble size nodule, NT  Nodes- no axillary nodes CVS- RRR, no murmur RESP-CTAB GU-  normal external genitalia, vaginal mucosa pink and moist, cervix visualized no growth, no blood form os, no  clear discharge, no CMT, no ovarian masses, uterus normal size ABD-NABS,soft,NT,ND EXT- No edema , Left foot FROM, mild TTP base of heel, NT at plantar insertion  Pulses- Radial, DP- 2+        Assessment & Plan:      Problem List Items Addressed This Visit      Unprioritized   Essential hypertension, benign   Relevant Medications   amLODipine (NORVASC) 5 MG tablet   Other Relevant Orders   Comprehensive metabolic panel (Completed)   Lipid panel (Completed)   TSH (Completed)   GAD (generalized anxiety disorder)   Relevant Medications   ALPRAZolam (XANAX) 0.5 MG tablet   traZODone (DESYREL) 100 MG tablet   IBS (irritable bowel syndrome)    Chronic abdominal pain with IBS syndrome.  She is on Tylenol 3 which maintains her pain she has been on this for years.  She has had worsening of her symptoms recently but because her insurance coming off she does not want have any imaging done due to the cough she [place]  With regards hemorrhoidal pain I have given her Anusol suppository  Relevant Medications   meclizine (ANTIVERT) 12.5 MG tablet   Panic disorder    Chronic anxiety she tapered off of her ProAir.  She does not want medication as it caused abdominal discomfort.  She is also on Wellbutrin in the past but had side effects.  We will keep her on alprazolam for now.  This was refilled for number 90 tablets      Relevant Medications   ALPRAZolam (XANAX) 0.5 MG tablet   traZODone (DESYREL) 100 MG tablet   Routine general medical examination at a health care facility - Primary    CPE done with Pap smear.  It was noted on her breast exam that she had a mass palpated lateral to the right nipple.  Diagnostic mammogram as needed we will try to get this in before her insurance terminates when she is aware  that she needs to have this follow-up as [place]      Relevant Orders   CBC with Differential/Platelet (Completed)   Comprehensive metabolic panel (Completed)   Lipid panel (Completed)   TSH (Completed)   Vertigo    mECLIZINE prn        Other Visit Diagnoses    Cervical cancer screening       Relevant Orders   Pap IG w/ reflex to HPV when ASC-U   Heel pain, chronic, left       Try icing, topical rub, possible heel spur, aytpical plantar fascitis   Relevant Medications   traZODone (DESYREL) 100 MG tablet   acetaminophen-codeine (TYLENOL #3) 300-30 MG tablet   Other Relevant Orders   Uric Acid (Completed)   Breast mass, right       Relevant Orders   US BREAST LTD UNI RIGHT INC AXILLA   MM DIAG BREAST TOMO UNI RIGHT      Note: This dictation was prepared with Dragon dictation along with smaller Company secretary. Any transcriptional errors that result from this process are unintentional.

## 2020-01-13 NOTE — Patient Instructions (Signed)
Meclizine for vertigo Xanax refilled and pain medication We will call with lab results F/U as needed

## 2020-01-14 ENCOUNTER — Other Ambulatory Visit: Payer: Self-pay | Admitting: Family Medicine

## 2020-01-14 ENCOUNTER — Encounter: Payer: Self-pay | Admitting: Family Medicine

## 2020-01-14 ENCOUNTER — Ambulatory Visit
Admission: RE | Admit: 2020-01-14 | Discharge: 2020-01-14 | Disposition: A | Discharge status: Home / Self Care | Payer: 59 | Source: Ambulatory Visit | Attending: Family Medicine | Admitting: Family Medicine

## 2020-01-14 DIAGNOSIS — N6310 Unspecified lump in the right breast, unspecified quadrant: Secondary | ICD-10-CM

## 2020-01-14 DIAGNOSIS — R42 Dizziness and giddiness: Secondary | ICD-10-CM | POA: Insufficient documentation

## 2020-01-14 LAB — CBC WITH DIFFERENTIAL/PLATELET
Absolute Monocytes: 470 cells/uL (ref 200–950)
Basophils Absolute: 58 cells/uL (ref 0–200)
Basophils Relative: 1 %
Eosinophils Absolute: 139 cells/uL (ref 15–500)
Eosinophils Relative: 2.4 %
HCT: 37 % (ref 35.0–45.0)
Hemoglobin: 12.3 g/dL (ref 11.7–15.5)
Lymphs Abs: 3039 cells/uL (ref 850–3900)
MCH: 33.7 pg — ABNORMAL HIGH (ref 27.0–33.0)
MCHC: 33.2 g/dL (ref 32.0–36.0)
MCV: 101.4 fL — ABNORMAL HIGH (ref 80.0–100.0)
MPV: 9.7 fL (ref 7.5–12.5)
Monocytes Relative: 8.1 %
Neutro Abs: 2094 cells/uL (ref 1500–7800)
Neutrophils Relative %: 36.1 %
Platelets: 267 10*3/uL (ref 140–400)
RBC: 3.65 10*6/uL — ABNORMAL LOW (ref 3.80–5.10)
RDW: 11.7 % (ref 11.0–15.0)
Total Lymphocyte: 52.4 %
WBC: 5.8 10*3/uL (ref 3.8–10.8)

## 2020-01-14 LAB — TSH: TSH: 2.73 mIU/L (ref 0.40–4.50)

## 2020-01-14 LAB — LIPID PANEL
Cholesterol: 196 mg/dL (ref <200)
HDL: 80 mg/dL (ref >=50)
LDL Cholesterol (Calc): 97 mg/dL (calc)
Non-HDL Cholesterol (Calc): 116 mg/dL (calc) (ref <130)
Total CHOL/HDL Ratio: 2.5 (calc) (ref <5.0)
Triglycerides: 93 mg/dL (ref <150)

## 2020-01-14 LAB — URIC ACID: Uric Acid, Serum: 4 mg/dL (ref 2.5–7.0)

## 2020-01-14 LAB — PAP IG W/ RFLX HPV ASCU

## 2020-01-14 LAB — COMPREHENSIVE METABOLIC PANEL
AG Ratio: 2 (calc) (ref 1.0–2.5)
ALT: 9 U/L (ref 6–29)
AST: 16 U/L (ref 10–35)
Albumin: 4.5 g/dL (ref 3.6–5.1)
Alkaline phosphatase (APISO): 51 U/L (ref 37–153)
BUN: 7 mg/dL (ref 7–25)
CO2: 27 mmol/L (ref 20–32)
Calcium: 9.7 mg/dL (ref 8.6–10.4)
Chloride: 100 mmol/L (ref 98–110)
Creat: 0.59 mg/dL (ref 0.50–0.99)
Globulin: 2.2 g/dL (calc) (ref 1.9–3.7)
Glucose, Bld: 80 mg/dL (ref 65–99)
Potassium: 3.9 mmol/L (ref 3.5–5.3)
Sodium: 135 mmol/L (ref 135–146)
Total Bilirubin: 0.4 mg/dL (ref 0.2–1.2)
Total Protein: 6.7 g/dL (ref 6.1–8.1)

## 2020-01-14 NOTE — Assessment & Plan Note (Signed)
Chronic abdominal pain with IBS syndrome.  She is on Tylenol 3 which maintains her pain she has been on this for years.  She has had worsening of her symptoms recently but because her insurance coming off she does not want have any imaging done due to the cough she will [place]  With regards hemorrhoidal pain I have given her Anusol suppository

## 2020-01-14 NOTE — Assessment & Plan Note (Signed)
CPE done with Pap smear.  It was noted on her breast exam that she had a mass palpated lateral to the right nipple.  Diagnostic mammogram as needed we will try to get this in before her insurance terminates when she is aware that she needs to have this follow-up as soon as [place]

## 2020-01-14 NOTE — Assessment & Plan Note (Signed)
mECLIZINE prn

## 2020-01-14 NOTE — Assessment & Plan Note (Signed)
Chronic anxiety she tapered off of her ProAir.  She does not want medication as it caused abdominal discomfort.  She is also on Wellbutrin in the past but had side effects.  We will keep her on alprazolam for now.  This was refilled for number 90 tablets

## 2020-01-19 ENCOUNTER — Telehealth: Payer: Self-pay | Admitting: *Deleted

## 2020-01-19 NOTE — Telephone Encounter (Signed)
Received call from patient.   Reports that she is moving to [place], near [place] in [place]. States that [facility] cannot refer for biopsy to different county.   Requested PCP to order referral to [facility] [phone] telephone.   Ok to place orders?

## 2020-01-20 NOTE — Telephone Encounter (Signed)
Spoke with patient and she would like to go to [facility] if possible in [place], [place] as this is where her new Primary care is going to be. Their phone number is [phone]

## 2020-01-21 NOTE — Telephone Encounter (Signed)
   Okay to place referral  Right breast mass,.abnormal Mammogram

## 2020-01-23 ENCOUNTER — Other Ambulatory Visit: Payer: Self-pay | Admitting: Family Medicine

## 2020-01-23 DIAGNOSIS — N6310 Unspecified lump in the right breast, unspecified quadrant: Secondary | ICD-10-CM

## 2020-01-23 NOTE — Telephone Encounter (Signed)
Called and spoke with patient and informed her that I am awaiting the signature of the order from Dr. Jeanice Lim so that I may fax over the order to [facility]. Once order is faxed I will then be able to call and get her scheduled. Patient verbalized understanding.

## 2020-01-23 NOTE — Telephone Encounter (Signed)
Received VM from patient.   Inquired as to status of referral.

## 2020-01-30 ENCOUNTER — Encounter: Payer: Self-pay | Admitting: Family Medicine

## 2020-02-03 ENCOUNTER — Telehealth: Payer: Self-pay | Admitting: Family Medicine

## 2020-02-03 NOTE — Telephone Encounter (Signed)
Received the breast biopsy report back from [facility] that patient had performed on 01/30/2020. It showed invasive ductal carcinoma, nottingham grade III (tubles 3, nuclei 2, mitoses 1). Please see report on your desk.

## 2020-02-03 NOTE — Telephone Encounter (Signed)
  Do you have a number for the surgeon who did biopsy She will need to coordinate care there and I dont know anyone or anything about there health system

## 2020-02-03 NOTE — Telephone Encounter (Signed)
Spoke with patient and she has already established with a OB/GYN Dr. Brooke Dare last week prior to biopsy he wanted the results. He is with [facility] so I informed her to contact them. She also has an appointment with her new primary care Dr. Luetta Nutting whom is with [facility]. She is scheduled to see them on 02/16/2020. I do not have the number for the surgeon but her new providers are all within that health system.

## 2020-02-03 NOTE — Telephone Encounter (Signed)
Dr. Luetta Nutting office is apart of [facility] so they should be able to see the biopsy

## 2020-02-03 NOTE — Telephone Encounter (Signed)
Okay, do we need to fax over results to Dr. Luetta Nutting, so they can help coordinate care  She needs to see oncologist and breast surgeon

## 2020-02-03 NOTE — Telephone Encounter (Signed)
Noted  

## 2020-07-27 ENCOUNTER — Other Ambulatory Visit: Payer: Self-pay | Admitting: Family Medicine

## 2020-12-06 IMAGING — MG MM DIGITAL DIAGNOSTIC UNILAT*R* W/ TOMO W/ CAD
6 series · 6 of 18 positions shown · non-contrast
Comparison: Previous exam(s).

CLINICAL DATA: Palpable lump in the right breast.

EXAM:
DIGITAL DIAGNOSTIC RIGHT MAMMOGRAM WITH CAD AND TOMO
ULTRASOUND RIGHT BREAST

[R MLO synth-2D]
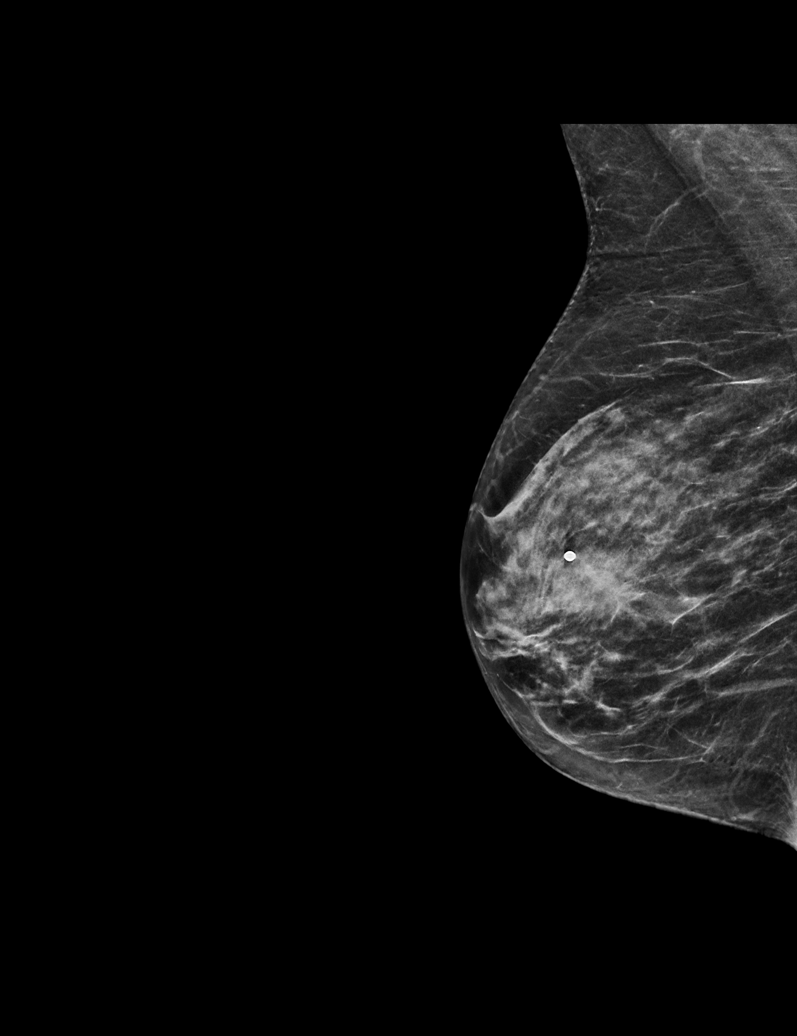

[R TAN synth-2D]
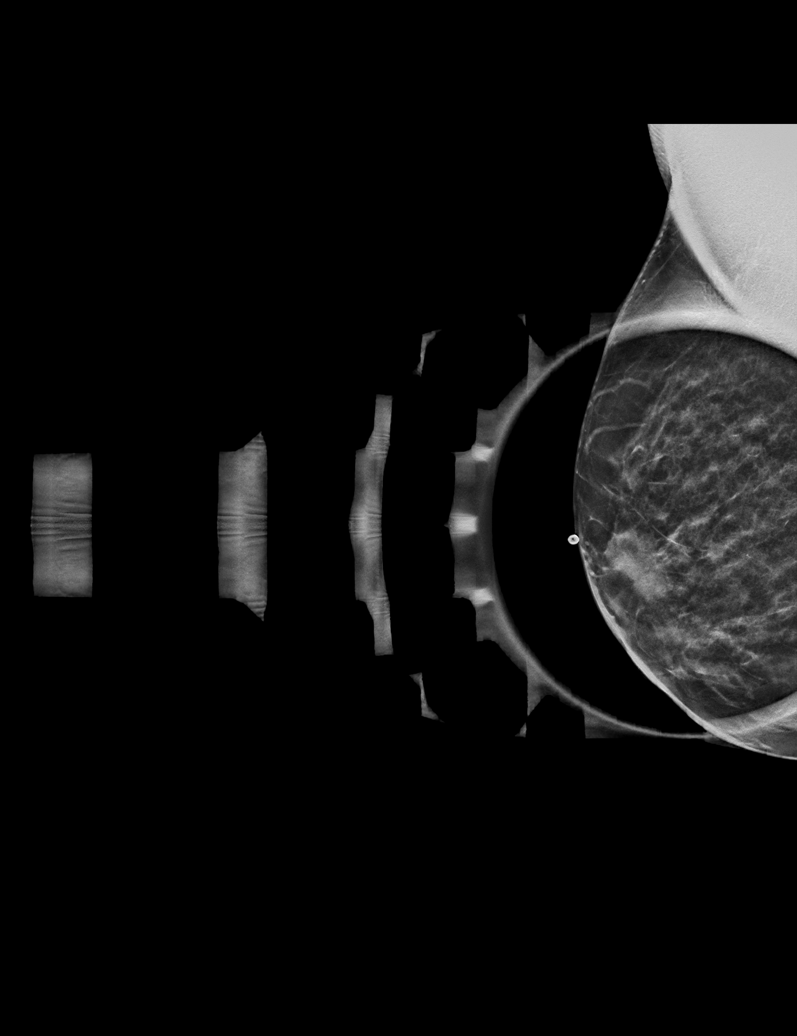

[R CC synth-2D]
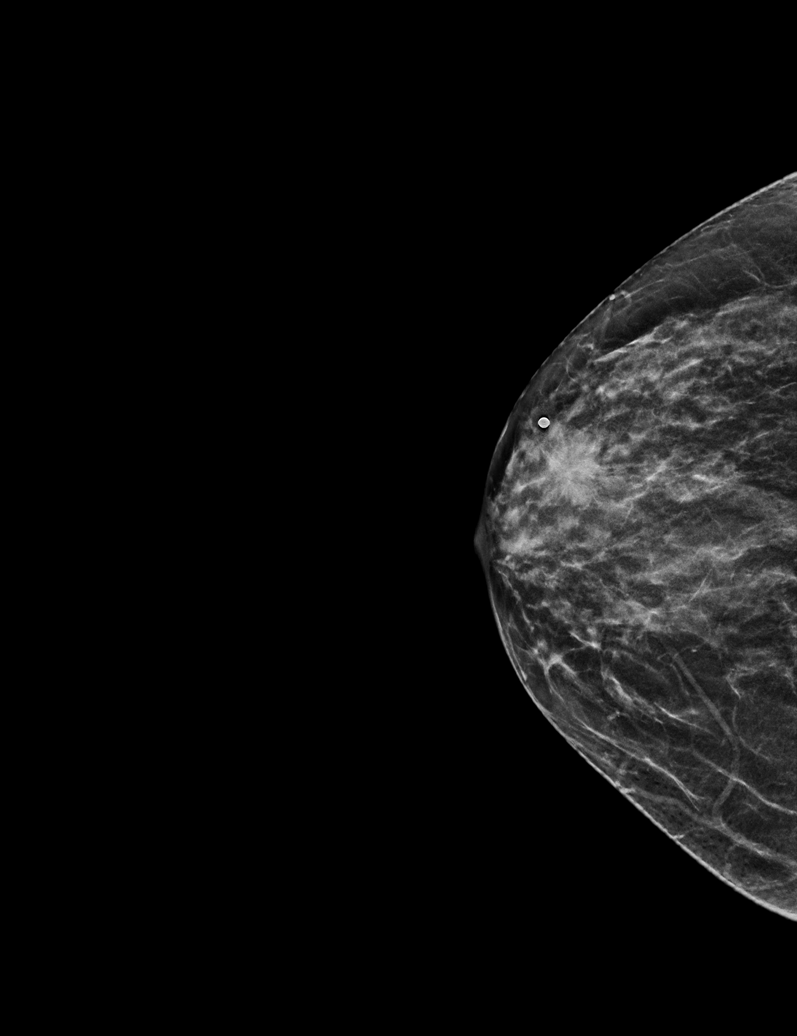

[R CC tomo · tomo slice 21/40.0]
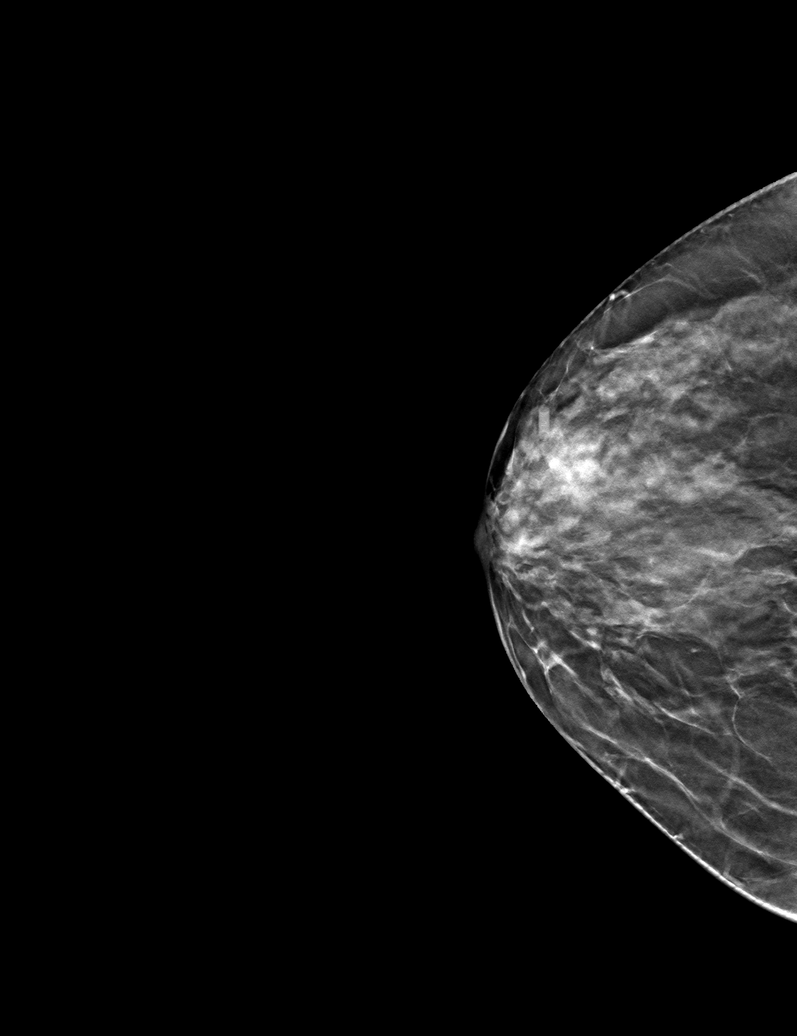

[R TAN tomo · tomo slice 19/36.0]
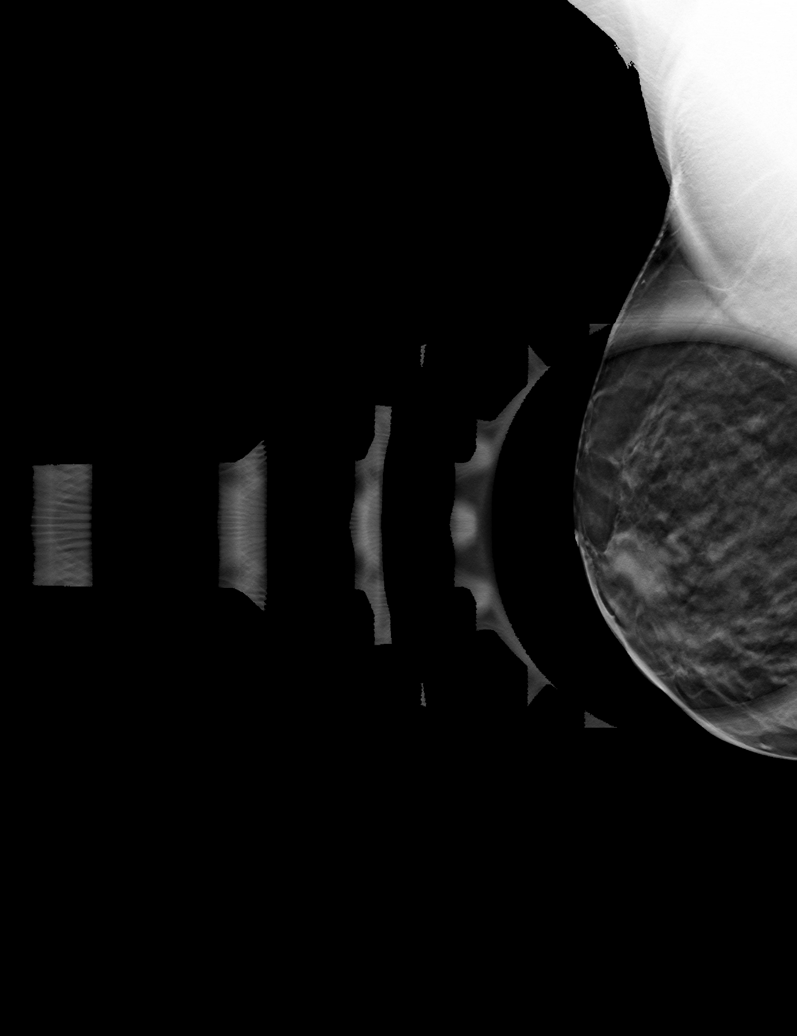

[R MLO tomo · tomo slice 25/48.0]
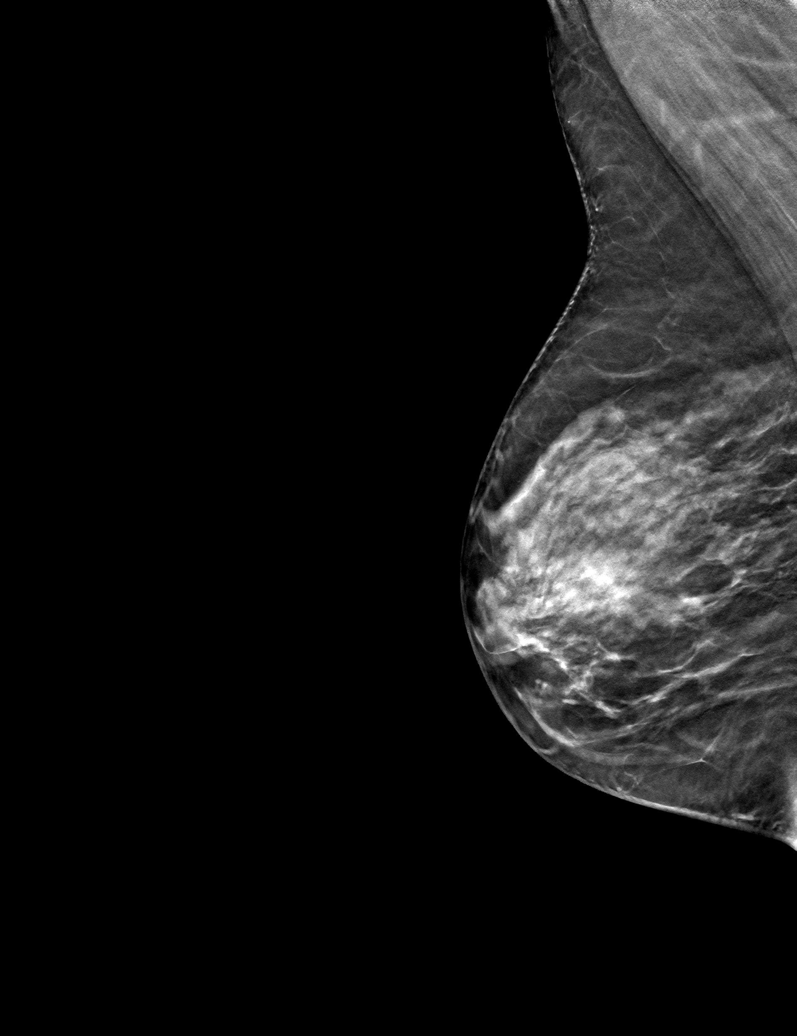

[6 of 18 positions shown; findings below may reference images not displayed]

ACR Breast Density Category c: The breast tissue is heterogeneously
dense, which may obscure small masses.
FINDINGS: There is a spiculated mass, largely obscured, in the region of the
patient's palpable lump. No other suspicious findings on the right.

Mammographic images were processed with CAD.

On physical exam, a palpable lump is felt in the anterior right
breast.

Targeted ultrasound is performed, showing an irregular hypoechoic
mass at 9 o'clock in the subareolar region measuring 1.9 x 1.0 by
1.8 cm. No axillary adenopathy.
IMPRESSION: Suspicious 1.9 cm mass in the right breast at 9 o'clock.

RECOMMENDATION:
Recommend ultrasound-guided biopsy of the right breast mass at 9
o'clock.

I have discussed the findings and recommendations with the patient.
If applicable, a reminder letter will be sent to the patient
regarding the next appointment.

BI-RADS CATEGORY  5: Highly suggestive of malignancy.
